# Patient Record
Sex: Female | Born: 1935 | Race: White | Hispanic: No
Health system: Southern US, Community
[De-identification: ages and names within clinical notes are randomized; demographics above are authoritative.]

## PROBLEM LIST (undated history)

## (undated) DIAGNOSIS — J849 Interstitial pulmonary disease, unspecified: Secondary | ICD-10-CM

## (undated) DIAGNOSIS — I10 Essential (primary) hypertension: Secondary | ICD-10-CM

## (undated) DIAGNOSIS — M1990 Unspecified osteoarthritis, unspecified site: Secondary | ICD-10-CM

## (undated) DIAGNOSIS — J449 Chronic obstructive pulmonary disease, unspecified: Secondary | ICD-10-CM

## (undated) DIAGNOSIS — C801 Malignant (primary) neoplasm, unspecified: Secondary | ICD-10-CM

## (undated) DIAGNOSIS — G4730 Sleep apnea, unspecified: Secondary | ICD-10-CM

## (undated) DIAGNOSIS — R590 Localized enlarged lymph nodes: Secondary | ICD-10-CM

## (undated) DIAGNOSIS — R0602 Shortness of breath: Secondary | ICD-10-CM

## (undated) DIAGNOSIS — K219 Gastro-esophageal reflux disease without esophagitis: Secondary | ICD-10-CM

## (undated) DIAGNOSIS — R51 Headache: Secondary | ICD-10-CM

## (undated) DIAGNOSIS — N189 Chronic kidney disease, unspecified: Secondary | ICD-10-CM

## (undated) HISTORY — PX: MULTIPLE TOOTH EXTRACTIONS: SHX2053

## (undated) HISTORY — PX: BREAST LUMPECTOMY WITH AXILLARY LYMPH NODE DISSECTION: SHX5756

---

## 2009-05-02 ENCOUNTER — Encounter: Admission: RE | Admit: 2009-05-02 | Discharge: 2009-05-02 | Payer: Self-pay | Admitting: Geriatric Medicine

## 2010-05-15 ENCOUNTER — Encounter: Admission: RE | Admit: 2010-05-15 | Discharge: 2010-05-15 | Payer: Self-pay | Admitting: Geriatric Medicine

## 2010-08-26 ENCOUNTER — Other Ambulatory Visit: Admission: RE | Admit: 2010-08-26 | Discharge: 2010-08-26 | Payer: Self-pay | Admitting: Obstetrics and Gynecology

## 2011-06-09 ENCOUNTER — Other Ambulatory Visit: Payer: Self-pay | Admitting: Geriatric Medicine

## 2011-06-09 DIAGNOSIS — Z1231 Encounter for screening mammogram for malignant neoplasm of breast: Secondary | ICD-10-CM

## 2011-06-09 DIAGNOSIS — Z853 Personal history of malignant neoplasm of breast: Secondary | ICD-10-CM

## 2011-06-25 ENCOUNTER — Ambulatory Visit
Admission: RE | Admit: 2011-06-25 | Discharge: 2011-06-25 | Disposition: A | Discharge status: Home / Self Care | Payer: Medicare Other | Source: Ambulatory Visit | Attending: Geriatric Medicine | Admitting: Geriatric Medicine

## 2011-06-25 DIAGNOSIS — Z853 Personal history of malignant neoplasm of breast: Secondary | ICD-10-CM

## 2011-06-25 DIAGNOSIS — Z1231 Encounter for screening mammogram for malignant neoplasm of breast: Secondary | ICD-10-CM

## 2011-08-20 ENCOUNTER — Ambulatory Visit: Payer: Medicare Other | Attending: Rheumatology | Admitting: Physical Therapy

## 2011-08-20 DIAGNOSIS — IMO0001 Reserved for inherently not codable concepts without codable children: Secondary | ICD-10-CM | POA: Insufficient documentation

## 2011-08-20 DIAGNOSIS — M2550 Pain in unspecified joint: Secondary | ICD-10-CM | POA: Insufficient documentation

## 2011-08-20 DIAGNOSIS — M2560 Stiffness of unspecified joint, not elsewhere classified: Secondary | ICD-10-CM | POA: Insufficient documentation

## 2011-08-20 DIAGNOSIS — R5381 Other malaise: Secondary | ICD-10-CM | POA: Insufficient documentation

## 2011-08-26 ENCOUNTER — Encounter: Payer: Medicare Other | Admitting: Physical Therapy

## 2011-08-28 ENCOUNTER — Ambulatory Visit: Payer: Medicare Other

## 2011-09-02 ENCOUNTER — Ambulatory Visit: Payer: Medicare Other | Admitting: Physical Therapy

## 2011-09-04 ENCOUNTER — Ambulatory Visit: Payer: Medicare Other | Admitting: Physical Therapy

## 2011-09-08 ENCOUNTER — Ambulatory Visit: Payer: Medicare Other | Admitting: Rehabilitation

## 2011-09-09 ENCOUNTER — Ambulatory Visit: Payer: Medicare Other | Attending: Rheumatology | Admitting: Rehabilitation

## 2011-09-09 DIAGNOSIS — M2550 Pain in unspecified joint: Secondary | ICD-10-CM | POA: Insufficient documentation

## 2011-09-09 DIAGNOSIS — M2560 Stiffness of unspecified joint, not elsewhere classified: Secondary | ICD-10-CM | POA: Insufficient documentation

## 2011-09-09 DIAGNOSIS — IMO0001 Reserved for inherently not codable concepts without codable children: Secondary | ICD-10-CM | POA: Insufficient documentation

## 2011-09-09 DIAGNOSIS — R5381 Other malaise: Secondary | ICD-10-CM | POA: Insufficient documentation

## 2011-09-11 ENCOUNTER — Ambulatory Visit: Payer: Medicare Other | Admitting: Rehabilitation

## 2011-09-22 ENCOUNTER — Encounter: Payer: Medicare Other | Admitting: Physical Therapy

## 2011-09-23 ENCOUNTER — Encounter: Payer: Medicare Other | Admitting: Physical Therapy

## 2011-09-24 ENCOUNTER — Encounter: Payer: Medicare Other | Admitting: Physical Therapy

## 2011-10-02 ENCOUNTER — Ambulatory Visit: Payer: Medicare Other | Admitting: Rehabilitation

## 2011-10-07 ENCOUNTER — Ambulatory Visit: Payer: Medicare Other | Admitting: Physical Therapy

## 2011-10-07 ENCOUNTER — Encounter: Payer: Medicare Other | Admitting: Physical Therapy

## 2011-10-09 ENCOUNTER — Ambulatory Visit: Payer: Medicare Other | Attending: Rheumatology | Admitting: Physical Therapy

## 2011-10-09 DIAGNOSIS — IMO0001 Reserved for inherently not codable concepts without codable children: Secondary | ICD-10-CM | POA: Insufficient documentation

## 2011-10-09 DIAGNOSIS — M2550 Pain in unspecified joint: Secondary | ICD-10-CM | POA: Insufficient documentation

## 2011-10-09 DIAGNOSIS — R5381 Other malaise: Secondary | ICD-10-CM | POA: Insufficient documentation

## 2011-10-09 DIAGNOSIS — M2560 Stiffness of unspecified joint, not elsewhere classified: Secondary | ICD-10-CM | POA: Insufficient documentation

## 2011-10-13 ENCOUNTER — Encounter: Payer: Medicare Other | Admitting: Physical Therapy

## 2011-10-14 ENCOUNTER — Ambulatory Visit: Payer: Medicare Other | Admitting: Physical Therapy

## 2011-10-16 ENCOUNTER — Ambulatory Visit: Payer: Medicare Other | Admitting: Physical Therapy

## 2011-10-23 ENCOUNTER — Ambulatory Visit: Payer: Medicare Other | Admitting: Physical Therapy

## 2011-10-27 ENCOUNTER — Ambulatory Visit: Payer: Medicare Other | Admitting: Physical Therapy

## 2011-10-28 ENCOUNTER — Ambulatory Visit: Payer: Medicare Other | Admitting: Physical Therapy

## 2011-11-04 ENCOUNTER — Ambulatory Visit: Payer: Medicare Other | Admitting: Physical Therapy

## 2011-11-06 ENCOUNTER — Encounter: Payer: Medicare Other | Admitting: Physical Therapy

## 2011-11-11 ENCOUNTER — Encounter: Payer: Medicare Other | Admitting: Physical Therapy

## 2011-11-13 ENCOUNTER — Encounter: Payer: Medicare Other | Admitting: Physical Therapy

## 2011-12-17 DIAGNOSIS — Z79899 Other long term (current) drug therapy: Secondary | ICD-10-CM | POA: Diagnosis not present

## 2011-12-17 DIAGNOSIS — R209 Unspecified disturbances of skin sensation: Secondary | ICD-10-CM | POA: Diagnosis not present

## 2011-12-17 DIAGNOSIS — I129 Hypertensive chronic kidney disease with stage 1 through stage 4 chronic kidney disease, or unspecified chronic kidney disease: Secondary | ICD-10-CM | POA: Diagnosis not present

## 2011-12-29 DIAGNOSIS — D239 Other benign neoplasm of skin, unspecified: Secondary | ICD-10-CM | POA: Diagnosis not present

## 2011-12-29 DIAGNOSIS — L821 Other seborrheic keratosis: Secondary | ICD-10-CM | POA: Diagnosis not present

## 2011-12-29 DIAGNOSIS — L820 Inflamed seborrheic keratosis: Secondary | ICD-10-CM | POA: Diagnosis not present

## 2012-01-07 DIAGNOSIS — H3530 Unspecified macular degeneration: Secondary | ICD-10-CM | POA: Diagnosis not present

## 2012-01-07 DIAGNOSIS — H259 Unspecified age-related cataract: Secondary | ICD-10-CM | POA: Diagnosis not present

## 2012-03-03 DIAGNOSIS — M1990 Unspecified osteoarthritis, unspecified site: Secondary | ICD-10-CM | POA: Diagnosis not present

## 2012-03-03 DIAGNOSIS — M2550 Pain in unspecified joint: Secondary | ICD-10-CM | POA: Diagnosis not present

## 2012-03-03 DIAGNOSIS — N189 Chronic kidney disease, unspecified: Secondary | ICD-10-CM | POA: Diagnosis not present

## 2012-06-21 DIAGNOSIS — I129 Hypertensive chronic kidney disease with stage 1 through stage 4 chronic kidney disease, or unspecified chronic kidney disease: Secondary | ICD-10-CM | POA: Diagnosis not present

## 2012-06-21 DIAGNOSIS — L659 Nonscarring hair loss, unspecified: Secondary | ICD-10-CM | POA: Diagnosis not present

## 2012-06-21 DIAGNOSIS — I714 Abdominal aortic aneurysm, without rupture: Secondary | ICD-10-CM | POA: Diagnosis not present

## 2012-06-30 DIAGNOSIS — M2550 Pain in unspecified joint: Secondary | ICD-10-CM | POA: Diagnosis not present

## 2012-06-30 DIAGNOSIS — M1990 Unspecified osteoarthritis, unspecified site: Secondary | ICD-10-CM | POA: Diagnosis not present

## 2012-06-30 DIAGNOSIS — G5600 Carpal tunnel syndrome, unspecified upper limb: Secondary | ICD-10-CM | POA: Diagnosis not present

## 2012-07-05 DIAGNOSIS — H2510 Age-related nuclear cataract, unspecified eye: Secondary | ICD-10-CM | POA: Diagnosis not present

## 2012-07-05 DIAGNOSIS — H3530 Unspecified macular degeneration: Secondary | ICD-10-CM | POA: Diagnosis not present

## 2012-07-07 DIAGNOSIS — M1710 Unilateral primary osteoarthritis, unspecified knee: Secondary | ICD-10-CM | POA: Diagnosis not present

## 2012-09-02 DIAGNOSIS — Z23 Encounter for immunization: Secondary | ICD-10-CM | POA: Diagnosis not present

## 2012-09-07 DIAGNOSIS — M1710 Unilateral primary osteoarthritis, unspecified knee: Secondary | ICD-10-CM | POA: Diagnosis not present

## 2012-10-07 DIAGNOSIS — J189 Pneumonia, unspecified organism: Secondary | ICD-10-CM | POA: Diagnosis not present

## 2012-10-07 DIAGNOSIS — R509 Fever, unspecified: Secondary | ICD-10-CM | POA: Diagnosis not present

## 2012-10-07 DIAGNOSIS — N390 Urinary tract infection, site not specified: Secondary | ICD-10-CM | POA: Diagnosis not present

## 2012-10-07 DIAGNOSIS — M793 Panniculitis, unspecified: Secondary | ICD-10-CM | POA: Diagnosis not present

## 2012-10-07 DIAGNOSIS — R5381 Other malaise: Secondary | ICD-10-CM | POA: Diagnosis not present

## 2012-11-03 DIAGNOSIS — R5383 Other fatigue: Secondary | ICD-10-CM | POA: Diagnosis not present

## 2012-11-03 DIAGNOSIS — L8990 Pressure ulcer of unspecified site, unspecified stage: Secondary | ICD-10-CM | POA: Diagnosis not present

## 2012-11-03 DIAGNOSIS — Z1331 Encounter for screening for depression: Secondary | ICD-10-CM | POA: Diagnosis not present

## 2012-11-03 DIAGNOSIS — I129 Hypertensive chronic kidney disease with stage 1 through stage 4 chronic kidney disease, or unspecified chronic kidney disease: Secondary | ICD-10-CM | POA: Diagnosis not present

## 2012-11-03 DIAGNOSIS — R259 Unspecified abnormal involuntary movements: Secondary | ICD-10-CM | POA: Diagnosis not present

## 2012-12-20 ENCOUNTER — Other Ambulatory Visit: Payer: Self-pay | Admitting: Geriatric Medicine

## 2012-12-20 DIAGNOSIS — Z1231 Encounter for screening mammogram for malignant neoplasm of breast: Secondary | ICD-10-CM

## 2012-12-28 DIAGNOSIS — M1710 Unilateral primary osteoarthritis, unspecified knee: Secondary | ICD-10-CM | POA: Diagnosis not present

## 2013-01-03 DIAGNOSIS — I129 Hypertensive chronic kidney disease with stage 1 through stage 4 chronic kidney disease, or unspecified chronic kidney disease: Secondary | ICD-10-CM | POA: Diagnosis not present

## 2013-01-03 DIAGNOSIS — B372 Candidiasis of skin and nail: Secondary | ICD-10-CM | POA: Diagnosis not present

## 2013-01-03 DIAGNOSIS — R5381 Other malaise: Secondary | ICD-10-CM | POA: Diagnosis not present

## 2013-01-03 DIAGNOSIS — R5383 Other fatigue: Secondary | ICD-10-CM | POA: Diagnosis not present

## 2013-01-03 DIAGNOSIS — J309 Allergic rhinitis, unspecified: Secondary | ICD-10-CM | POA: Diagnosis not present

## 2013-01-03 DIAGNOSIS — M25569 Pain in unspecified knee: Secondary | ICD-10-CM | POA: Diagnosis not present

## 2013-01-13 ENCOUNTER — Other Ambulatory Visit: Payer: Self-pay | Admitting: Obstetrics and Gynecology

## 2013-01-13 ENCOUNTER — Other Ambulatory Visit (HOSPITAL_COMMUNITY)
Admission: RE | Admit: 2013-01-13 | Discharge: 2013-01-13 | Disposition: A | Discharge status: Home / Self Care | Payer: Medicare Other | Source: Ambulatory Visit | Attending: Obstetrics and Gynecology | Admitting: Obstetrics and Gynecology

## 2013-01-13 DIAGNOSIS — Z01419 Encounter for gynecological examination (general) (routine) without abnormal findings: Secondary | ICD-10-CM | POA: Diagnosis not present

## 2013-01-13 DIAGNOSIS — Z1151 Encounter for screening for human papillomavirus (HPV): Secondary | ICD-10-CM | POA: Insufficient documentation

## 2013-01-20 ENCOUNTER — Ambulatory Visit: Payer: Medicare Other

## 2013-02-08 ENCOUNTER — Ambulatory Visit
Admission: RE | Admit: 2013-02-08 | Discharge: 2013-02-08 | Disposition: A | Discharge status: Home / Self Care | Payer: Medicare Other | Source: Ambulatory Visit | Attending: Geriatric Medicine | Admitting: Geriatric Medicine

## 2013-02-08 DIAGNOSIS — Z1231 Encounter for screening mammogram for malignant neoplasm of breast: Secondary | ICD-10-CM | POA: Diagnosis not present

## 2013-04-13 DIAGNOSIS — I129 Hypertensive chronic kidney disease with stage 1 through stage 4 chronic kidney disease, or unspecified chronic kidney disease: Secondary | ICD-10-CM | POA: Diagnosis not present

## 2013-04-13 DIAGNOSIS — B372 Candidiasis of skin and nail: Secondary | ICD-10-CM | POA: Diagnosis not present

## 2013-05-09 ENCOUNTER — Other Ambulatory Visit (HOSPITAL_COMMUNITY): Payer: Self-pay | Admitting: Geriatric Medicine

## 2013-05-09 DIAGNOSIS — R0602 Shortness of breath: Secondary | ICD-10-CM | POA: Diagnosis not present

## 2013-05-09 DIAGNOSIS — I129 Hypertensive chronic kidney disease with stage 1 through stage 4 chronic kidney disease, or unspecified chronic kidney disease: Secondary | ICD-10-CM | POA: Diagnosis not present

## 2013-05-17 ENCOUNTER — Ambulatory Visit (HOSPITAL_COMMUNITY)
Admission: RE | Admit: 2013-05-17 | Discharge: 2013-05-17 | Disposition: A | Discharge status: Home / Self Care | Payer: Medicare Other | Source: Ambulatory Visit | Attending: Geriatric Medicine | Admitting: Geriatric Medicine

## 2013-05-17 DIAGNOSIS — R0602 Shortness of breath: Secondary | ICD-10-CM | POA: Diagnosis not present

## 2013-05-17 MED ORDER — ALBUTEROL SULFATE (5 MG/ML) 0.5% IN NEBU
2.5000 mg | INHALATION_SOLUTION | Freq: Once | RESPIRATORY_TRACT | Status: AC
  Administered 2013-05-17: 2.5 mg via RESPIRATORY_TRACT

## 2013-05-31 DIAGNOSIS — J449 Chronic obstructive pulmonary disease, unspecified: Secondary | ICD-10-CM | POA: Diagnosis not present

## 2013-05-31 DIAGNOSIS — R21 Rash and other nonspecific skin eruption: Secondary | ICD-10-CM | POA: Diagnosis not present

## 2013-05-31 DIAGNOSIS — R0602 Shortness of breath: Secondary | ICD-10-CM | POA: Diagnosis not present

## 2013-06-24 DIAGNOSIS — R0602 Shortness of breath: Secondary | ICD-10-CM | POA: Diagnosis not present

## 2013-06-29 DIAGNOSIS — I10 Essential (primary) hypertension: Secondary | ICD-10-CM | POA: Diagnosis not present

## 2013-06-29 DIAGNOSIS — R0602 Shortness of breath: Secondary | ICD-10-CM | POA: Diagnosis not present

## 2013-07-13 DIAGNOSIS — H52209 Unspecified astigmatism, unspecified eye: Secondary | ICD-10-CM | POA: Diagnosis not present

## 2013-07-13 DIAGNOSIS — H2510 Age-related nuclear cataract, unspecified eye: Secondary | ICD-10-CM | POA: Diagnosis not present

## 2013-08-18 DIAGNOSIS — Z23 Encounter for immunization: Secondary | ICD-10-CM | POA: Diagnosis not present

## 2013-09-28 NOTE — Patient Instructions (Addendum)
22 Shelia Gardner  09/28/2013   Your procedure is scheduled on: 10/05/13  Report to [facility] at 5:30 AM.  Call this number if you have problems the morning of surgery [phone]: [phone]   Remember:   Do not eat food or drink liquids After Midnight.     Take these medicines the morning of surgery with A SIP OF WATER: Amlodipine,Pantoprazole,may use Albuterol inhaler if needed   Do not wear jewelry, make-up or nail polish.  Do not wear lotions, powders, or perfumes. You may wear deodorant.  Do not shave 48 hours prior to surgery. Men may shave face and neck.  Do not bring valuables to the hospital.  Contacts, dentures or bridgework may not be worn into surgery.  Leave suitcase in the car. After surgery it may be brought to your room.  For patients admitted to the hospital, checkout time is 11:00 AM the day of discharge.   Patients discharged the day of surgery will not be allowed to drive home.  Name and phone number of your driver: n/a  Special Instructions: n/a    Please read over the following fact sheets that you were given: MRSA Information, incentive spirometry fact sheet, blood fact sheet. Merleen Nicely, RN  pre op nurse call if needed [phone]    FAILURE TO FOLLOW THESE INSTRUCTIONS MAY RESULT IN CANCELLATION OF YOUR SURGERY   Patient Signature: ___________________________________________

## 2013-09-28 NOTE — Progress Notes (Signed)
Chest x-ray 10/07/12 on chart, EKG 2010 on chart

## 2013-09-28 NOTE — H&P (Signed)
TOTAL KNEE ADMISSION H&P  Patient is being admitted for right total knee arthroplasty.  Subjective:  Chief Complaint:right knee pain.  HPI: Shelia Gardner, 79 y.o. female, has a history of pain and functional disability in the right knee due to arthritis and has failed non-surgical conservative treatments for greater than 12 weeks to includeNSAID's and/or analgesics, corticosteriod injections, use of assistive devices, weight reduction as appropriate and activity modification.  Onset of symptoms was gradual, starting >10 years ago with gradually worsening course since that time. The patient noted no past surgery on the right knee(s).  Patient currently rates pain in the right knee(s) at 7 out of 10 with activity. Patient has night pain, worsening of pain with activity and weight bearing, pain that interferes with activities of daily living, pain with passive range of motion, crepitus and joint swelling.  Patient has evidence of periarticular osteophytes and joint space narrowing by imaging studies.  There is no active infection.  Past Medical History Impaired Hearing Hypertension COPD Hemorrhoids Urinary Incontinence Breast Cancer Spinal stenosis Osteoarthritis   Past Surgical History Lumpectomy- breast  Medication History Biotin ( Capsule, Oral) Active. AmLODIPine Besylate (2.5MG  Tablet, Oral) Active. Hydrochlorothiazide (25MG  Tablet, Oral) Active. Pantoprazole Sodium (40MG  Tablet DR, Oral) Active. Tylenol Extra Strength (500MG  Tablet, Oral) Active. Nystop (100000 UNIT/GM Powder, External) Active. Protonix (40MG  Tablet DR, Oral) Active. Multiple Vitamin ( Oral) Active. Fish Oil Active. Ocuvite ( Oral) Active. Albuterol Sulfate ER ( Oral) Specific dose unknown - Active. Citracal + D ( Oral) Specific dose unknown - Active. Lisinopril (10MG  Tablet, Oral) Active.   Allergies not on file  History  Substance Use Topics  . Smoking status: Not on file  . Smokeless  tobacco: Not on file  . Alcohol Use: Not on file    Family History Father deceased age 64 due MI Mother deceased age 40 due to breast cancer  Review of Systems  Constitutional: Negative.   HENT: Positive for hearing loss. Negative for congestion, ear discharge, ear pain, nosebleeds, sore throat and tinnitus.   Eyes: Negative.   Respiratory: Positive for shortness of breath. Negative for cough, hemoptysis, sputum production, wheezing and stridor.        SOB with exertion  Cardiovascular: Positive for leg swelling. Negative for chest pain, palpitations, orthopnea, claudication and PND.  Gastrointestinal: Positive for diarrhea and constipation. Negative for heartburn, nausea, vomiting, abdominal pain, blood in stool and melena.  Genitourinary: Positive for frequency. Negative for dysuria, urgency, hematuria and flank pain.  Musculoskeletal: Positive for joint pain. Negative for back pain, falls, myalgias and neck pain.       Right knee pain  Skin: Negative.   Neurological: Positive for tingling. Negative for dizziness, tremors, sensory change, speech change, focal weakness, seizures, loss of consciousness and headaches.  Endo/Heme/Allergies: Positive for environmental allergies. Negative for polydipsia. Does not bruise/bleed easily.  Psychiatric/Behavioral: Negative.     Objective:  Physical Exam  Constitutional: She is oriented to person, place, and time. She appears well-developed and well-nourished. No distress.  HENT:  Head: Normocephalic and atraumatic.  Right Ear: External ear normal.  Left Ear: External ear normal.  Nose: Nose normal.  Mouth/Throat: Oropharynx is clear and moist.  Eyes: Conjunctivae and EOM are normal.  Neck: Normal range of motion. Neck supple.  Cardiovascular: Normal rate, regular rhythm, normal heart sounds and intact distal pulses.   No murmur heard. Respiratory: Effort normal. No respiratory distress. She has wheezes.  GI: Soft. Bowel sounds are  normal. She exhibits no distension.  There is no tenderness.  Musculoskeletal:       Right hip: Normal.       Left hip: Normal.       Right knee: She exhibits decreased range of motion and swelling. She exhibits no effusion and no laceration. Tenderness found. Medial joint line tenderness noted. No lateral joint line tenderness noted.       Left knee: She exhibits decreased range of motion and swelling. She exhibits no effusion and no erythema. Tenderness found. Medial joint line tenderness noted. No lateral joint line tenderness noted.       Right lower leg: She exhibits no tenderness and no swelling.       Left lower leg: She exhibits no tenderness and no swelling.  Right knee tender medially. No effusion or ligamentous instability. 0 to 115 degrees. Signficant patellofemoral crepitus bilaterally. Signficant genu varus bialterally, greater in right. Left knee tender along the medial joint line. Mild soft tissue swelling. Ligaments intact. 0 to 115 degrees. Normal painless motion in the hips. No tenderness to palpation about the greater trochanteric bursae.  Neurological: She is alert and oriented to person, place, and time. She has normal strength and normal reflexes. No sensory deficit.  Skin: No rash noted. She is not diaphoretic. No erythema.  Psychiatric: She has a normal mood and affect. Her behavior is normal.   Vitals Weight: 250 lb Height: 63 in Body Surface Area: 2.25 m Body Mass Index: 44.29 kg/m BP: 135/82 (Sitting, Left Arm, Standard)  HR: 72 bpm  Imaging Review Plain radiographs demonstrate severe degenerative joint disease of the right knee(s). The overall alignment issignificant varus. The bone quality appears to be good for age and reported activity level.  Assessment/Plan:  End stage arthritis, right knee   The patient history, physical examination, clinical judgment of the provider and imaging studies are consistent with end stage degenerative joint disease of  the right knee(s) and total knee arthroplasty is deemed medically necessary. The treatment options including medical management, injection therapy arthroscopy and arthroplasty were discussed at length. The risks and benefits of total knee arthroplasty were presented and reviewed. The risks due to aseptic loosening, infection, stiffness, patella tracking problems, thromboembolic complications and other imponderables were discussed. The patient acknowledged the explanation, agreed to proceed with the plan and consent was signed. Patient is being admitted for inpatient treatment for surgery, pain control, PT, OT, prophylactic antibiotics, VTE prophylaxis, progressive ambulation and ADL's and discharge planning. The patient is planning to be discharged to skilled nursing facility [facility])     Dimitri Ped, [place]

## 2013-09-29 ENCOUNTER — Encounter (HOSPITAL_COMMUNITY): Payer: Self-pay

## 2013-09-29 ENCOUNTER — Other Ambulatory Visit (HOSPITAL_COMMUNITY): Payer: Self-pay | Admitting: *Deleted

## 2013-09-29 ENCOUNTER — Encounter (HOSPITAL_COMMUNITY): Payer: Self-pay | Admitting: Pharmacy Technician

## 2013-09-29 ENCOUNTER — Ambulatory Visit (HOSPITAL_COMMUNITY)
Admission: RE | Admit: 2013-09-29 | Discharge: 2013-09-29 | Disposition: A | Discharge status: Home / Self Care | Payer: Medicare Other | Source: Ambulatory Visit | Attending: Surgical | Admitting: Surgical

## 2013-09-29 ENCOUNTER — Encounter (HOSPITAL_COMMUNITY)
Admission: RE | Admit: 2013-09-29 | Discharge: 2013-09-29 | Disposition: A | Discharge status: Home / Self Care | Payer: Medicare Other | Source: Ambulatory Visit | Attending: Orthopedic Surgery | Admitting: Orthopedic Surgery

## 2013-09-29 DIAGNOSIS — M19019 Primary osteoarthritis, unspecified shoulder: Secondary | ICD-10-CM | POA: Insufficient documentation

## 2013-09-29 DIAGNOSIS — I10 Essential (primary) hypertension: Secondary | ICD-10-CM | POA: Diagnosis not present

## 2013-09-29 DIAGNOSIS — M1710 Unilateral primary osteoarthritis, unspecified knee: Secondary | ICD-10-CM | POA: Insufficient documentation

## 2013-09-29 DIAGNOSIS — Z01818 Encounter for other preprocedural examination: Secondary | ICD-10-CM | POA: Insufficient documentation

## 2013-09-29 DIAGNOSIS — Z01812 Encounter for preprocedural laboratory examination: Secondary | ICD-10-CM | POA: Diagnosis not present

## 2013-09-29 DIAGNOSIS — M5380 Other specified dorsopathies, site unspecified: Secondary | ICD-10-CM | POA: Insufficient documentation

## 2013-09-29 HISTORY — DX: Gastro-esophageal reflux disease without esophagitis: K21.9

## 2013-09-29 HISTORY — DX: Headache: R51

## 2013-09-29 HISTORY — DX: Shortness of breath: R06.02

## 2013-09-29 HISTORY — DX: Unspecified osteoarthritis, unspecified site: M19.90

## 2013-09-29 HISTORY — DX: Essential (primary) hypertension: I10

## 2013-09-29 LAB — COMPREHENSIVE METABOLIC PANEL
ALT: 19 U/L (ref 0–35)
AST: 23 U/L (ref 0–37)
Albumin: 4.1 g/dL (ref 3.5–5.2)
Alkaline Phosphatase: 84 U/L (ref 39–117)
BUN: 22 mg/dL (ref 6–23)
CO2: 25 mEq/L (ref 19–32)
Calcium: 10.7 mg/dL — ABNORMAL HIGH (ref 8.4–10.5)
Chloride: 103 mEq/L (ref 96–112)
Creatinine, Ser: 1.2 mg/dL — ABNORMAL HIGH (ref 0.50–1.10)
GFR calc Af Amer: 48 mL/min — ABNORMAL LOW (ref >90)
GFR calc non Af Amer: 42 mL/min — ABNORMAL LOW (ref >90)
Glucose, Bld: 126 mg/dL — ABNORMAL HIGH (ref 70–99)
Potassium: 4.8 mEq/L (ref 3.5–5.1)
Sodium: 140 mEq/L (ref 135–145)
Total Bilirubin: 0.4 mg/dL (ref 0.3–1.2)
Total Protein: 7.7 g/dL (ref 6.0–8.3)

## 2013-09-29 LAB — ABO/RH: ABO/RH(D): O POS

## 2013-09-29 LAB — CBC
Hemoglobin: 14.1 g/dL (ref 12.0–15.0)
MCH: 29.4 pg (ref 26.0–34.0)
MCHC: 32.3 g/dL (ref 30.0–36.0)
MCV: 91 fL (ref 78.0–100.0)
RBC: 4.79 MIL/uL (ref 3.87–5.11)
RDW: 14.2 % (ref 11.5–15.5)
WBC: 11.2 10*3/uL — ABNORMAL HIGH (ref 4.0–10.5)

## 2013-09-29 LAB — URINALYSIS, ROUTINE W REFLEX MICROSCOPIC
Bilirubin Urine: NEGATIVE
Glucose, UA: NEGATIVE mg/dL
Hgb urine dipstick: NEGATIVE
Ketones, ur: NEGATIVE mg/dL
Nitrite: NEGATIVE
Protein, ur: NEGATIVE mg/dL
Specific Gravity, Urine: 1.015 (ref 1.005–1.030)
Urobilinogen, UA: 0.2 mg/dL (ref 0.0–1.0)
pH: 6 (ref 5.0–8.0)

## 2013-09-29 LAB — PROTIME-INR
INR: 0.98 (ref 0.00–1.49)
Prothrombin Time: 12.8 seconds (ref 11.6–15.2)

## 2013-09-29 LAB — URINE MICROSCOPIC-ADD ON

## 2013-09-29 LAB — APTT: aPTT: 31 seconds (ref 24–37)

## 2013-09-29 LAB — SURGICAL PCR SCREEN: Staphylococcus aureus: NEGATIVE

## 2013-09-29 NOTE — Progress Notes (Signed)
09/29/13 1226  OBSTRUCTIVE SLEEP APNEA  Have you ever been diagnosed with sleep apnea through a sleep study? No  Do you snore loudly (loud enough to be heard through closed doors)?  0  Do you often feel tired, fatigued, or sleepy during the daytime? 1  Has anyone observed you stop breathing during your sleep? 0  Do you have, or are you being treated for high blood pressure? 1  BMI more than 35 kg/m2? 1  Age over 77 years old? 1  Neck circumference greater than 40 cm/18 inches? 0  Gender: 0  Obstructive Sleep Apnea Score 4  Score 4 or greater  Results sent to PCP

## 2013-09-30 LAB — URINE CULTURE
Colony Count: NO GROWTH
Culture: NO GROWTH

## 2013-10-03 NOTE — Progress Notes (Signed)
Patient states she has problems with yeast under breast, on stomach and inner thighs.  Patient was concerned about using the chlorhexidine soap due to not to use on open wounds or sores.  Asked patient if Dr Shelia Gardner was aware of this.  Patient stated Shelia Gardner told her to make sure he is aware of on day of surgery.  Patient states she is unable to tell if has broken skin at areas of under breasts, inner thighs or on stomach.  Instructed patient to use Dial Soap which is antibacterial soap instead of chlorhexidine if she feels uncomfortable using Chlorhexidine.  Patient voiced understanding.

## 2013-10-04 NOTE — Anesthesia Preprocedure Evaluation (Addendum)
Anesthesia Evaluation  Patient identified by MRN, date of birth, ID band Patient awake    Reviewed: Allergy & Precautions, H&P , NPO status , Patient's Chart, lab work & pertinent test results  Airway Mallampati: II TM Distance: >3 FB Neck ROM: full    Dental  (+) Caps All upper front and most of lower teeth are all capped or bridged:   Pulmonary neg pulmonary ROS, shortness of breath and with exertion,  breath sounds clear to auscultation  Pulmonary exam normal       Cardiovascular hypertension, Pt. on medications Rhythm:regular Rate:Normal     Neuro/Psych negative neurological ROS  negative psych ROS   GI/Hepatic negative GI ROS, Neg liver ROS, GERD-  Medicated and Controlled,  Endo/Other  negative endocrine ROSMorbid obesity  Renal/GU negative Renal ROS  negative genitourinary   Musculoskeletal   Abdominal (+) + obese,   Peds  Hematology negative hematology ROS (+)   Anesthesia Other Findings   Reproductive/Obstetrics negative OB ROS                         Anesthesia Physical Anesthesia Plan  ASA: III  Anesthesia Plan: General   Post-op Pain Management:    Induction: Intravenous  Airway Management Planned: Oral ETT  Additional Equipment:   Intra-op Plan:   Post-operative Plan: Extubation in OR  Informed Consent: I have reviewed the patients History and Physical, chart, labs and discussed the procedure including the risks, benefits and alternatives for the proposed anesthesia with the patient or authorized representative who has indicated his/her understanding and acceptance.   Dental Advisory Given  Plan Discussed with: CRNA and Surgeon  Anesthesia Plan Comments:         Anesthesia Quick Evaluation

## 2013-10-05 ENCOUNTER — Inpatient Hospital Stay (HOSPITAL_COMMUNITY): Payer: Medicare Other | Admitting: Anesthesiology

## 2013-10-05 ENCOUNTER — Inpatient Hospital Stay (HOSPITAL_COMMUNITY): Payer: Medicare Other

## 2013-10-05 ENCOUNTER — Encounter (HOSPITAL_COMMUNITY): Payer: Self-pay | Admitting: *Deleted

## 2013-10-05 ENCOUNTER — Encounter (HOSPITAL_COMMUNITY): Payer: Medicare Other | Admitting: Anesthesiology

## 2013-10-05 ENCOUNTER — Encounter (HOSPITAL_COMMUNITY)
Admission: RE | Disposition: A | Discharge status: Skilled Nursing Facility | Payer: Self-pay | Source: Ambulatory Visit | Attending: Orthopedic Surgery

## 2013-10-05 ENCOUNTER — Inpatient Hospital Stay (HOSPITAL_COMMUNITY)
Admission: RE | Admit: 2013-10-05 | Discharge: 2013-10-08 | DRG: 470 | Disposition: A | Discharge status: Skilled Nursing Facility | Payer: Medicare Other | Source: Ambulatory Visit | Attending: Orthopedic Surgery | Admitting: Orthopedic Surgery

## 2013-10-05 DIAGNOSIS — M6281 Muscle weakness (generalized): Secondary | ICD-10-CM | POA: Diagnosis not present

## 2013-10-05 DIAGNOSIS — J449 Chronic obstructive pulmonary disease, unspecified: Secondary | ICD-10-CM | POA: Diagnosis not present

## 2013-10-05 DIAGNOSIS — Z6841 Body Mass Index (BMI) 40.0 and over, adult: Secondary | ICD-10-CM

## 2013-10-05 DIAGNOSIS — M1710 Unilateral primary osteoarthritis, unspecified knee: Principal | ICD-10-CM | POA: Diagnosis present

## 2013-10-05 DIAGNOSIS — Z8249 Family history of ischemic heart disease and other diseases of the circulatory system: Secondary | ICD-10-CM

## 2013-10-05 DIAGNOSIS — Z853 Personal history of malignant neoplasm of breast: Secondary | ICD-10-CM | POA: Diagnosis not present

## 2013-10-05 DIAGNOSIS — M24569 Contracture, unspecified knee: Secondary | ICD-10-CM | POA: Diagnosis present

## 2013-10-05 DIAGNOSIS — J189 Pneumonia, unspecified organism: Secondary | ICD-10-CM | POA: Diagnosis not present

## 2013-10-05 DIAGNOSIS — M1990 Unspecified osteoarthritis, unspecified site: Secondary | ICD-10-CM | POA: Diagnosis not present

## 2013-10-05 DIAGNOSIS — J4489 Other specified chronic obstructive pulmonary disease: Secondary | ICD-10-CM | POA: Diagnosis present

## 2013-10-05 DIAGNOSIS — R32 Unspecified urinary incontinence: Secondary | ICD-10-CM | POA: Diagnosis present

## 2013-10-05 DIAGNOSIS — R269 Unspecified abnormalities of gait and mobility: Secondary | ICD-10-CM | POA: Diagnosis not present

## 2013-10-05 DIAGNOSIS — R509 Fever, unspecified: Secondary | ICD-10-CM | POA: Diagnosis not present

## 2013-10-05 DIAGNOSIS — D62 Acute posthemorrhagic anemia: Secondary | ICD-10-CM | POA: Diagnosis not present

## 2013-10-05 DIAGNOSIS — Z96651 Presence of right artificial knee joint: Secondary | ICD-10-CM

## 2013-10-05 DIAGNOSIS — M4800 Spinal stenosis, site unspecified: Secondary | ICD-10-CM | POA: Diagnosis present

## 2013-10-05 DIAGNOSIS — I10 Essential (primary) hypertension: Secondary | ICD-10-CM | POA: Diagnosis present

## 2013-10-05 DIAGNOSIS — Z471 Aftercare following joint replacement surgery: Secondary | ICD-10-CM | POA: Diagnosis not present

## 2013-10-05 DIAGNOSIS — Z96659 Presence of unspecified artificial knee joint: Secondary | ICD-10-CM | POA: Diagnosis not present

## 2013-10-05 DIAGNOSIS — K219 Gastro-esophageal reflux disease without esophagitis: Secondary | ICD-10-CM | POA: Diagnosis present

## 2013-10-05 DIAGNOSIS — H9190 Unspecified hearing loss, unspecified ear: Secondary | ICD-10-CM | POA: Diagnosis present

## 2013-10-05 DIAGNOSIS — S8990XA Unspecified injury of unspecified lower leg, initial encounter: Secondary | ICD-10-CM | POA: Diagnosis not present

## 2013-10-05 DIAGNOSIS — D649 Anemia, unspecified: Secondary | ICD-10-CM | POA: Diagnosis not present

## 2013-10-05 DIAGNOSIS — M25569 Pain in unspecified knee: Secondary | ICD-10-CM | POA: Diagnosis not present

## 2013-10-05 DIAGNOSIS — M1712 Unilateral primary osteoarthritis, left knee: Secondary | ICD-10-CM | POA: Diagnosis present

## 2013-10-05 DIAGNOSIS — IMO0002 Reserved for concepts with insufficient information to code with codable children: Secondary | ICD-10-CM | POA: Diagnosis not present

## 2013-10-05 DIAGNOSIS — R279 Unspecified lack of coordination: Secondary | ICD-10-CM | POA: Diagnosis not present

## 2013-10-05 HISTORY — PX: TOTAL KNEE ARTHROPLASTY: SHX125

## 2013-10-05 LAB — TYPE AND SCREEN
ABO/RH(D): O POS
Antibody Screen: NEGATIVE

## 2013-10-05 SURGERY — ARTHROPLASTY, KNEE, TOTAL
Anesthesia: General | Site: Knee | Laterality: Right | Wound class: Clean

## 2013-10-05 MED ORDER — HYDROMORPHONE HCL PF 1 MG/ML IJ SOLN
INTRAMUSCULAR | Status: DC | PRN
  Administered 2013-10-05 (×2): 0.5 mg via INTRAVENOUS
  Administered 2013-10-05: 1 mg via INTRAVENOUS

## 2013-10-05 MED ORDER — LACTATED RINGERS IV SOLN: INTRAVENOUS | Status: DC

## 2013-10-05 MED ORDER — AMLODIPINE BESYLATE 2.5 MG PO TABS
2.5000 mg | ORAL_TABLET | Freq: Every morning | ORAL | Status: DC
  Administered 2013-10-05 – 2013-10-07 (×3): 2.5 mg via ORAL
  Filled 2013-10-05 (×3): qty 1

## 2013-10-05 MED ORDER — ONDANSETRON HCL 4 MG/2ML IJ SOLN: 4.0000 mg | Freq: Four times a day (QID) | INTRAMUSCULAR | Status: DC | PRN

## 2013-10-05 MED ORDER — HYDROMORPHONE HCL PF 1 MG/ML IJ SOLN
INTRAMUSCULAR | Status: AC
  Filled 2013-10-05: qty 1

## 2013-10-05 MED ORDER — ALBUTEROL SULFATE HFA 108 (90 BASE) MCG/ACT IN AERS: 2.0000 | INHALATION_SPRAY | Freq: Four times a day (QID) | RESPIRATORY_TRACT | Status: DC | PRN

## 2013-10-05 MED ORDER — CEFAZOLIN SODIUM-DEXTROSE 2-3 GM-% IV SOLR
INTRAVENOUS | Status: AC
  Filled 2013-10-05: qty 50

## 2013-10-05 MED ORDER — PHENOL 1.4 % MT LIQD: 1.0000 | OROMUCOSAL | Status: DC | PRN

## 2013-10-05 MED ORDER — BISACODYL 10 MG RE SUPP: 10.0000 mg | Freq: Every day | RECTAL | Status: DC | PRN

## 2013-10-05 MED ORDER — SODIUM CHLORIDE 0.9 % IJ SOLN
INTRAMUSCULAR | Status: DC | PRN
  Administered 2013-10-05: 09:00:00

## 2013-10-05 MED ORDER — CEFAZOLIN SODIUM 1-5 GM-% IV SOLN
1.0000 g | Freq: Four times a day (QID) | INTRAVENOUS | Status: AC
  Administered 2013-10-05 (×2): 1 g via INTRAVENOUS
  Filled 2013-10-05 (×2): qty 50

## 2013-10-05 MED ORDER — METHOCARBAMOL 500 MG PO TABS
500.0000 mg | ORAL_TABLET | Freq: Four times a day (QID) | ORAL | Status: DC | PRN
  Administered 2013-10-05 – 2013-10-07 (×3): 500 mg via ORAL
  Filled 2013-10-05 (×3): qty 1

## 2013-10-05 MED ORDER — FERROUS SULFATE 325 (65 FE) MG PO TABS
325.0000 mg | ORAL_TABLET | Freq: Three times a day (TID) | ORAL | Status: DC
  Administered 2013-10-06 – 2013-10-08 (×7): 325 mg via ORAL
  Filled 2013-10-05 (×10): qty 1

## 2013-10-05 MED ORDER — GLYCOPYRROLATE 0.2 MG/ML IJ SOLN
INTRAMUSCULAR | Status: DC | PRN
  Administered 2013-10-05: .6 mg via INTRAVENOUS

## 2013-10-05 MED ORDER — ALUM & MAG HYDROXIDE-SIMETH 200-200-20 MG/5ML PO SUSP: 30.0000 mL | ORAL | Status: DC | PRN

## 2013-10-05 MED ORDER — THROMBIN 5000 UNITS EX SOLR
CUTANEOUS | Status: AC
  Filled 2013-10-05: qty 5000

## 2013-10-05 MED ORDER — CELECOXIB 200 MG PO CAPS
200.0000 mg | ORAL_CAPSULE | Freq: Two times a day (BID) | ORAL | Status: DC
  Administered 2013-10-06 – 2013-10-08 (×5): 200 mg via ORAL
  Filled 2013-10-05 (×8): qty 1

## 2013-10-05 MED ORDER — LACTATED RINGERS IV SOLN
INTRAVENOUS | Status: DC
  Administered 2013-10-05 (×2): via INTRAVENOUS

## 2013-10-05 MED ORDER — HYDROCHLOROTHIAZIDE 25 MG PO TABS
25.0000 mg | ORAL_TABLET | Freq: Every morning | ORAL | Status: DC
  Administered 2013-10-05 – 2013-10-07 (×3): 25 mg via ORAL
  Filled 2013-10-05 (×3): qty 1

## 2013-10-05 MED ORDER — NEOSTIGMINE METHYLSULFATE 1 MG/ML IJ SOLN
INTRAMUSCULAR | Status: DC | PRN
  Administered 2013-10-05: 4 mg via INTRAVENOUS

## 2013-10-05 MED ORDER — ALBUTEROL SULFATE (5 MG/ML) 0.5% IN NEBU
2.5000 mg | INHALATION_SOLUTION | Freq: Once | RESPIRATORY_TRACT | Status: AC
  Administered 2013-10-05: 2.5 mg via RESPIRATORY_TRACT
  Filled 2013-10-05: qty 0.5

## 2013-10-05 MED ORDER — CEFAZOLIN SODIUM-DEXTROSE 2-3 GM-% IV SOLR
2.0000 g | INTRAVENOUS | Status: AC
  Administered 2013-10-05: 2 g via INTRAVENOUS

## 2013-10-05 MED ORDER — DEXAMETHASONE SODIUM PHOSPHATE 10 MG/ML IJ SOLN
INTRAMUSCULAR | Status: DC | PRN
  Administered 2013-10-05: 10 mg via INTRAVENOUS

## 2013-10-05 MED ORDER — FLEET ENEMA 7-19 GM/118ML RE ENEM: 1.0000 | ENEMA | Freq: Once | RECTAL | Status: AC | PRN

## 2013-10-05 MED ORDER — SODIUM CHLORIDE 0.9 % IR SOLN
Status: DC | PRN
  Administered 2013-10-05: 3000 mL

## 2013-10-05 MED ORDER — MENTHOL 3 MG MT LOZG: 1.0000 | LOZENGE | OROMUCOSAL | Status: DC | PRN

## 2013-10-05 MED ORDER — POLYETHYLENE GLYCOL 3350 17 G PO PACK
17.0000 g | PACK | Freq: Every day | ORAL | Status: DC | PRN
  Filled 2013-10-05: qty 1

## 2013-10-05 MED ORDER — LACTATED RINGERS IV SOLN
INTRAVENOUS | Status: DC
  Administered 2013-10-06 (×2): via INTRAVENOUS

## 2013-10-05 MED ORDER — ACETAMINOPHEN 650 MG RE SUPP: 650.0000 mg | Freq: Four times a day (QID) | RECTAL | Status: DC | PRN

## 2013-10-05 MED ORDER — ALBUTEROL SULFATE (5 MG/ML) 0.5% IN NEBU
INHALATION_SOLUTION | RESPIRATORY_TRACT | Status: AC
  Administered 2013-10-05: 2.5 mg
  Filled 2013-10-05: qty 0.5

## 2013-10-05 MED ORDER — SODIUM CHLORIDE 0.9 % IN NEBU
INHALATION_SOLUTION | RESPIRATORY_TRACT | Status: AC
  Filled 2013-10-05: qty 3

## 2013-10-05 MED ORDER — BUPIVACAINE LIPOSOME 1.3 % IJ SUSP
20.0000 mL | Freq: Once | INTRAMUSCULAR | Status: AC
  Administered 2013-10-05: 20 mL
  Filled 2013-10-05: qty 20

## 2013-10-05 MED ORDER — HYDROCODONE-ACETAMINOPHEN 5-325 MG PO TABS
1.0000 | ORAL_TABLET | ORAL | Status: DC | PRN
  Administered 2013-10-05 – 2013-10-08 (×9): 1 via ORAL
  Filled 2013-10-05 (×9): qty 1

## 2013-10-05 MED ORDER — PROPOFOL 10 MG/ML IV BOLUS
INTRAVENOUS | Status: DC | PRN
  Administered 2013-10-05: 200 mg via INTRAVENOUS

## 2013-10-05 MED ORDER — ONDANSETRON HCL 4 MG PO TABS: 4.0000 mg | ORAL_TABLET | Freq: Four times a day (QID) | ORAL | Status: DC | PRN

## 2013-10-05 MED ORDER — LISINOPRIL 5 MG PO TABS
5.0000 mg | ORAL_TABLET | Freq: Every morning | ORAL | Status: DC
  Administered 2013-10-05 – 2013-10-07 (×3): 5 mg via ORAL
  Filled 2013-10-05 (×3): qty 1

## 2013-10-05 MED ORDER — ROCURONIUM BROMIDE 100 MG/10ML IV SOLN
INTRAVENOUS | Status: DC | PRN
  Administered 2013-10-05: 50 mg via INTRAVENOUS

## 2013-10-05 MED ORDER — HYDROMORPHONE HCL PF 1 MG/ML IJ SOLN
0.2500 mg | INTRAMUSCULAR | Status: DC | PRN
  Administered 2013-10-05 (×2): 0.5 mg via INTRAVENOUS

## 2013-10-05 MED ORDER — LIDOCAINE HCL (CARDIAC) 20 MG/ML IV SOLN
INTRAVENOUS | Status: DC | PRN
  Administered 2013-10-05: 100 mg via INTRAVENOUS

## 2013-10-05 MED ORDER — OXYCODONE-ACETAMINOPHEN 5-325 MG PO TABS: 2.0000 | ORAL_TABLET | ORAL | Status: DC | PRN

## 2013-10-05 MED ORDER — THROMBIN 5000 UNITS EX SOLR
OROMUCOSAL | Status: DC | PRN
  Administered 2013-10-05: 09:00:00 via TOPICAL

## 2013-10-05 MED ORDER — HYDROMORPHONE HCL PF 1 MG/ML IJ SOLN: 1.0000 mg | INTRAMUSCULAR | Status: DC | PRN

## 2013-10-05 MED ORDER — RIVAROXABAN 10 MG PO TABS
10.0000 mg | ORAL_TABLET | Freq: Every day | ORAL | Status: DC
  Administered 2013-10-06 – 2013-10-08 (×3): 10 mg via ORAL
  Filled 2013-10-05 (×5): qty 1

## 2013-10-05 MED ORDER — FENTANYL CITRATE 0.05 MG/ML IJ SOLN
INTRAMUSCULAR | Status: DC | PRN
  Administered 2013-10-05: 100 ug via INTRAVENOUS
  Administered 2013-10-05: 50 ug via INTRAVENOUS
  Administered 2013-10-05: 100 ug via INTRAVENOUS

## 2013-10-05 MED ORDER — ACETAMINOPHEN 325 MG PO TABS
650.0000 mg | ORAL_TABLET | Freq: Four times a day (QID) | ORAL | Status: DC | PRN
  Administered 2013-10-06 – 2013-10-08 (×3): 650 mg via ORAL
  Filled 2013-10-05 (×3): qty 2

## 2013-10-05 MED ORDER — SODIUM CHLORIDE 0.9 % IR SOLN
Status: DC | PRN
  Administered 2013-10-05: 08:00:00

## 2013-10-05 MED ORDER — METHOCARBAMOL 100 MG/ML IJ SOLN
500.0000 mg | Freq: Four times a day (QID) | INTRAVENOUS | Status: DC | PRN
  Administered 2013-10-05: 500 mg via INTRAVENOUS
  Filled 2013-10-05: qty 5

## 2013-10-05 MED ORDER — ONDANSETRON HCL 4 MG/2ML IJ SOLN
INTRAMUSCULAR | Status: DC | PRN
  Administered 2013-10-05: 4 mg via INTRAVENOUS

## 2013-10-05 SURGICAL SUPPLY — 67 items
ANCHOR PEEK ZIP 5.5 NDL NO2 (Orthopedic Implant) ×2 IMPLANT
BAG ZIPLOCK 12X15 (MISCELLANEOUS) ×2 IMPLANT
BANDAGE ELASTIC 4 VELCRO ST LF (GAUZE/BANDAGES/DRESSINGS) ×2 IMPLANT
BANDAGE ELASTIC 6 VELCRO ST LF (GAUZE/BANDAGES/DRESSINGS) ×2 IMPLANT
BANDAGE ESMARK 6X9 LF (GAUZE/BANDAGES/DRESSINGS) ×1 IMPLANT
BLADE SAG 18X100X1.27 (BLADE) ×2 IMPLANT
BLADE SAW SGTL 11.0X1.19X90.0M (BLADE) ×2 IMPLANT
BNDG ESMARK 6X9 LF (GAUZE/BANDAGES/DRESSINGS) ×2
BONE CEMENT GENTAMICIN (Cement) ×4 IMPLANT
CAPT RP KNEE ×2 IMPLANT
CEMENT BONE GENTAMICIN 40 (Cement) ×2 IMPLANT
CLOTH BEACON ORANGE TIMEOUT ST (SAFETY) ×2 IMPLANT
CUFF TOURN SGL QUICK 34 (TOURNIQUET CUFF) ×1
CUFF TRNQT CYL 34X4X40X1 (TOURNIQUET CUFF) ×1 IMPLANT
DERMABOND ADVANCED (GAUZE/BANDAGES/DRESSINGS) ×1
DERMABOND ADVANCED .7 DNX12 (GAUZE/BANDAGES/DRESSINGS) ×1 IMPLANT
DRAPE EXTREMITY T 121X128X90 (DRAPE) ×2 IMPLANT
DRAPE INCISE IOBAN 66X45 STRL (DRAPES) ×2 IMPLANT
DRAPE LG THREE QUARTER DISP (DRAPES) ×2 IMPLANT
DRAPE POUCH INSTRU U-SHP 10X18 (DRAPES) ×2 IMPLANT
DRAPE U-SHAPE 47X51 STRL (DRAPES) ×2 IMPLANT
DRSG AQUACEL AG ADV 3.5X10 (GAUZE/BANDAGES/DRESSINGS) ×2 IMPLANT
DRSG PAD ABDOMINAL 8X10 ST (GAUZE/BANDAGES/DRESSINGS) ×8 IMPLANT
DRSG TEGADERM 4X4.75 (GAUZE/BANDAGES/DRESSINGS) ×2 IMPLANT
DRSG TEGADERM 8X12 (GAUZE/BANDAGES/DRESSINGS) ×2 IMPLANT
DURAPREP 26ML APPLICATOR (WOUND CARE) ×2 IMPLANT
ELECT REM PT RETURN 9FT ADLT (ELECTROSURGICAL) ×2
ELECTRODE REM PT RTRN 9FT ADLT (ELECTROSURGICAL) ×1 IMPLANT
EVACUATOR 1/8 PVC DRAIN (DRAIN) ×2 IMPLANT
GAUZE SPONGE 2X2 8PLY STRL LF (GAUZE/BANDAGES/DRESSINGS) ×1 IMPLANT
GLOVE BIOGEL PI IND STRL 8 (GLOVE) ×1 IMPLANT
GLOVE BIOGEL PI INDICATOR 8 (GLOVE) ×1
GLOVE ECLIPSE 8.0 STRL XLNG CF (GLOVE) ×4 IMPLANT
GLOVE SURG SS PI 6.5 STRL IVOR (GLOVE) ×4 IMPLANT
GOWN PREVENTION PLUS LG XLONG (DISPOSABLE) ×6 IMPLANT
GOWN STRL REIN XL XLG (GOWN DISPOSABLE) ×2 IMPLANT
HANDPIECE INTERPULSE COAX TIP (DISPOSABLE) ×1
IMMOBILIZER KNEE 20 (SOFTGOODS)
IMMOBILIZER KNEE 20 THIGH 36 (SOFTGOODS) IMPLANT
IMMOBILIZER KNEE 22 UNIV (SOFTGOODS) ×2 IMPLANT
KIT BASIN OR (CUSTOM PROCEDURE TRAY) ×2 IMPLANT
MANIFOLD NEPTUNE II (INSTRUMENTS) ×2 IMPLANT
NEEDLE HYPO 22GX1.5 SAFETY (NEEDLE) ×2 IMPLANT
NS IRRIG 1000ML POUR BTL (IV SOLUTION) ×2 IMPLANT
PACK TOTAL JOINT (CUSTOM PROCEDURE TRAY) ×2 IMPLANT
PAD ABD 7.5X8 STRL (GAUZE/BANDAGES/DRESSINGS) ×2 IMPLANT
PADDING CAST COTTON 6X4 STRL (CAST SUPPLIES) ×2 IMPLANT
POSITIONER SURGICAL ARM (MISCELLANEOUS) ×2 IMPLANT
SET HNDPC FAN SPRY TIP SCT (DISPOSABLE) ×1 IMPLANT
SET PAD KNEE POSITIONER (MISCELLANEOUS) ×2 IMPLANT
SPONGE GAUZE 2X2 STER 10/PKG (GAUZE/BANDAGES/DRESSINGS) ×1
SPONGE LAP 18X18 X RAY DECT (DISPOSABLE) ×2 IMPLANT
SPONGE SURGIFOAM ABS GEL 100 (HEMOSTASIS) ×2 IMPLANT
STAPLER VISISTAT 35W (STAPLE) IMPLANT
SUT BONE WAX W31G (SUTURE) ×2 IMPLANT
SUT MNCRL AB 4-0 PS2 18 (SUTURE) ×2 IMPLANT
SUT VIC AB 1 CT1 27 (SUTURE) ×2
SUT VIC AB 1 CT1 27XBRD ANTBC (SUTURE) ×2 IMPLANT
SUT VIC AB 2-0 CT1 27 (SUTURE) ×4
SUT VIC AB 2-0 CT1 TAPERPNT 27 (SUTURE) ×4 IMPLANT
SUT VLOC 180 0 24IN GS25 (SUTURE) ×2 IMPLANT
SYR 20CC LL (SYRINGE) ×2 IMPLANT
TOWEL OR 17X26 10 PK STRL BLUE (TOWEL DISPOSABLE) ×4 IMPLANT
TOWER CARTRIDGE SMART MIX (DISPOSABLE) ×2 IMPLANT
TRAY FOLEY CATH 14FRSI W/METER (CATHETERS) ×2 IMPLANT
WATER STERILE IRR 1500ML POUR (IV SOLUTION) ×2 IMPLANT
WRAP KNEE MAXI GEL POST OP (GAUZE/BANDAGES/DRESSINGS) ×4 IMPLANT

## 2013-10-05 NOTE — Interval H&P Note (Signed)
History and Physical Interval Note:  10/05/2013 7:10 AM  Shelia Gardner  has presented today for surgery, with the diagnosis of OA RIGHT KNEE   The various methods of treatment have been discussed with the patient and family. After consideration of risks, benefits and other options for treatment, the patient has consented to  Procedure(s): TOTAL RIGHT  KNEE ARTHROPLASTY (Right) as a surgical intervention .  The patient's history has been reviewed, patient examined, no change in status, stable for surgery.  I have reviewed the patient's chart and labs.  Questions were answered to the patient's satisfaction.     [name]

## 2013-10-05 NOTE — Progress Notes (Signed)
Utilization review completed.  

## 2013-10-05 NOTE — Brief Op Note (Signed)
10/05/2013  9:45 AM  PATIENT:  Bethanie Dicker Countess  79 y.o. female  PRE-OPERATIVE DIAGNOSIS:  OA RIGHT KNEE ,with severe  Contracture. Morbid Obesity  POST-OPERATIVE DIAGNOSIS:  OA right knee,with severe contracture. Morbid Obesity.  PROCEDURE:  Procedure(s): TOTAL RIGHT  KNEE ARTHROPLASTY (Right) and release of Flexion Contracture.  SURGEON:  Surgeon(s) and Role:    * Jacki Cones, MD - Primary  PHYSICIAN ASSISTANT: Dimitri Ped PA  ASSISTANTS:Amber [place] PA  ANESTHESIA:   general  EBL:  Total I/O In: 1000 [I.V.:1000] Out: 160 [Urine:160]  BLOOD ADMINISTERED:none  DRAINS: (one) Hemovact drain(s) in the Right Knee with  Suction Open   LOCAL MEDICATIONS USED:  BUPIVICAINE 20cc mixed with 20cc of Normal Saline  SPECIMEN:  No Specimen  DISPOSITION OF SPECIMEN:  N/A  COUNTS:  YES  TOURNIQUET:  * Missing tourniquet times found for documented tourniquets in log:  [id number] *  DICTATION: .Other Dictation: Dictation Number [id number]  PLAN OF CARE: Admit to inpatient   PATIENT DISPOSITION:  Stable in OR   Delay start of Pharmacological VTE agent (>24hrs) due to surgical blood loss or risk of bleeding: yes

## 2013-10-05 NOTE — Anesthesia Postprocedure Evaluation (Signed)
  Anesthesia Post-op Note  Patient: Shelia Gardner  Procedure(s) Performed: Procedure(s) (LRB): TOTAL RIGHT  KNEE ARTHROPLASTY (Right)  Patient Location: PACU  Anesthesia Type: General  Level of Consciousness: awake and alert   Airway and Oxygen Therapy: Patient Spontanous Breathing  Post-op Pain: mild  Post-op Assessment: Post-op Vital signs reviewed, Patient's Cardiovascular Status Stable, Respiratory Function Stable, Patent Airway and No signs of Nausea or vomiting  Last Vitals:  Filed Vitals:   10/05/13 1214  BP: 149/61  Pulse: 80  Temp: 36.8 C  Resp: 12    Post-op Vital Signs: stable   Complications: No apparent anesthesia complications

## 2013-10-05 NOTE — Preoperative (Signed)
Beta Blockers   Reason not to administer Beta Blockers:Not Applicable 

## 2013-10-05 NOTE — Transfer of Care (Signed)
Immediate Anesthesia Transfer of Care Note  Patient: Shelia Gardner  Procedure(s) Performed: Procedure(s): TOTAL RIGHT  KNEE ARTHROPLASTY (Right)  Patient Location: PACU  Anesthesia Type:General  Level of Consciousness: awake, alert , oriented and patient cooperative  Airway & Oxygen Therapy: Patient Spontanous Breathing and Patient connected to nasal cannula oxygen  Post-op Assessment: Report given to PACU RN, Post -op Vital signs reviewed and stable and Patient moving all extremities  Post vital signs: Reviewed and stable  Complications: No apparent anesthesia complications

## 2013-10-06 ENCOUNTER — Encounter (HOSPITAL_COMMUNITY): Payer: Self-pay | Admitting: Orthopedic Surgery

## 2013-10-06 LAB — BASIC METABOLIC PANEL
BUN: 19 mg/dL (ref 6–23)
CO2: 24 mEq/L (ref 19–32)
Chloride: 101 mEq/L (ref 96–112)
Creatinine, Ser: 1.05 mg/dL (ref 0.50–1.10)
GFR calc Af Amer: 57 mL/min — ABNORMAL LOW (ref >90)
Glucose, Bld: 157 mg/dL — ABNORMAL HIGH (ref 70–99)
Potassium: 4.3 mEq/L (ref 3.5–5.1)

## 2013-10-06 LAB — CBC
HCT: 33.5 % — ABNORMAL LOW (ref 36.0–46.0)
Hemoglobin: 11.3 g/dL — ABNORMAL LOW (ref 12.0–15.0)
MCH: 30.3 pg (ref 26.0–34.0)
MCV: 89.8 fL (ref 78.0–100.0)
Platelets: 235 10*3/uL (ref 150–400)
RBC: 3.73 MIL/uL — ABNORMAL LOW (ref 3.87–5.11)
RDW: 14.1 % (ref 11.5–15.5)
WBC: 20.3 10*3/uL — ABNORMAL HIGH (ref 4.0–10.5)

## 2013-10-06 MED ORDER — LIP MEDEX EX OINT
TOPICAL_OINTMENT | CUTANEOUS | Status: DC | PRN
  Filled 2013-10-06: qty 7

## 2013-10-06 NOTE — Progress Notes (Signed)
OT Cancellation Note  Patient Details Name: Shelia Gardner MRN: [id number] DOB: 01-03-1930   Cancelled Treatment:    Reason Eval/Treat Not Completed: Other (comment) Defer OT to SNF.  [name] [phone] 10/06/2013, 12:59 PM

## 2013-10-06 NOTE — Progress Notes (Signed)
Pt's O2 decreased from 4l to 2L, unable to decrease to RA d/t sat dropping to 88-92%.  Transferred to rm 1814 with O2.   Condition stable.

## 2013-10-06 NOTE — Op Note (Signed)
NAMEKORAIMA, ALBERTSEN           ACCOUNT NO.:  [id number]  MEDICAL RECORD NO.:  [id number]  LOCATION:  1228                         FACILITY:  [facility]  PHYSICIAN:  Georges Lynch. Makenzi Bannister, M.D.DATE OF BIRTH:  04-Sep-1930  DATE OF PROCEDURE:  10/05/2013 DATE OF DISCHARGE:                              OPERATIVE REPORT   SURGEON:  Georges Lynch. Darrelyn Hillock, M.D.  ASSISTANT:  Dimitri Ped, [place]  PREOPERATIVE DIAGNOSES: 1. Severe flexion contracture, right knee. 2. Bone on bone severe degenerative arthritis with a flexion     contracture. 3. Morbid obesity.  POSTOPERATIVE DIAGNOSES: 1. Severe flexion contracture, right knee. 2. Bone on bone severe degenerative arthritis with a flexion     contracture.  OPERATION:  Right total knee arthroplasty utilizing the DePuy system. All 3 components were cemented.  Gentamicin was used in the cement.  The sizes used were as follows a size 3 right posterior cruciate sacrificing type femoral component.  The tibial tray was a size 4.  The insert was a size 3, 15 mm thickness rotating platform.  The patella was a size 38 with 3 pegs.  DESCRIPTION OF PROCEDURE:  Under general anesthesia, routine orthopedic prep and draping of the right lower extremity was carried out. Appropriate time-out was first carried out.  I also marked the appropriate right leg in the holding area.  The patient had 2 g of IV Ancef.  At this time, the leg was exsanguinated with an Esmarch, tourniquet was elevated at 325 mmHg.  The leg was placed in the [facility] knee holder.  The knee was flexed and an anterior approach to the hip and knee was carried out.  Bleeders were identified and cauterized.  I then created 2 flaps.  A median parapatellar incision was made.  Patella was reflected laterally.  Knee was flexed again and I did medial and lateral meniscectomies and excised the anterior and posterior cruciate ligaments.  At this time, initial drill hole was made in the intercondylar notch  of the femur.  Guide rod was inserted.  We removed 30 mm thickness off the distal femur because of her contraction and she was severely morbidly obese.  At this time, after removing the 13 mm thickness, we measured the femur to be a size 3 right.  We then made our appropriate anterior-posterior chamfer cuts for a size 3 right femoral component.  We then directed attention to the tibia and we utilized initial drill hole in the intercondylar notch and in the tibial plateau. I then removed 6 mm thickness.  We had to go take another 2 mm thickness off the tibia mainly because of the severe collapse of the medial joint. After this, the spacer blocks were inserted.  I then removed the posterior osteophytes.  We thoroughly irrigated out the area, searched for loose pieces of bone.  At this time, we then directed attention to the spacer device and we felt that the 15 mm was the best choice for [place] at this point, she was with morbid obesity especially.  We then cut our keel cut in the usual fashion and the tibial plateau after I made some drill holes in the medial tibial plateau region.  After this, we made  sure that we did appropriate size tray again it was a size 4.  We then directed attention after making our keel cuts were made.  Attention to the plate to the distal femur with notch cut out of the distal femur in usual.  After this, we inserted our trial components for a size 3 right femur and size 4 tray with a 15-mm thickness insert.  We then did a resurfacing procedure on the patella for the root in the usual fashion. Made 3 drill holes in the patella, articular surface for a 38 mm patella.  All trial components were removed, thoroughly water picked out the knee and cemented all 3 components in simultaneously with gentamicin in the cement.  Once the cement was hardened, all loose pieces were removed.  I then injected a mixture of 20 mL of Exparel with 20 mL of normal saline I used after  mixture injected posterior medial compartments, also up into the vastus and quadriceps muscle.  At this time, I water picked out the knee, made sure there were no other loose pieces of cement.  I inserted my permanent size 15, size 3 tray with a rotating platform, reduced the knee and had excellent stability, excellent motion.  The patella then was reflected back into its normal position.  I inserted a Stryker anchor in the proximal tibia to reinforce the repair of the patellar tendon.  Remaining part of the wound was closed in layers over Hemovac drain.  Sterile dressings were applied.          ______________________________ Georges Lynch Darrelyn Hillock, M.D.     RAG/MEDQ  D:  10/05/2013  T:  10/06/2013  Job:  [id number]

## 2013-10-06 NOTE — Progress Notes (Signed)
Clinical Social Work Department BRIEF PSYCHOSOCIAL ASSESSMENT 10/06/2013  Patient:  Shelia Gardner, Shelia Gardner     Account Number:  [id number]     Admit date:  10/05/2013  Clinical Social Worker:  Candie Chroman  Date/Time:  10/06/2013 02:40 PM  Referred by:  Physician  Date Referred:  10/06/2013 Referred for  SNF Placement   Other Referral:   Interview type:  Patient Other interview type:    PSYCHOSOCIAL DATA Living Status:  HUSBAND Admitted from facility:   Level of care:   Primary support name:  Raiford Simmonds Primary support relationship to patient:  CHILD, ADULT Degree of support available:   unclear    CURRENT CONCERNS Current Concerns  Post-Acute Placement   Other Concerns:    SOCIAL WORK ASSESSMENT / PLAN Pt is an 79 yr old female living at home prior to hospitalization. CSW met with pt / spouse to assist with d/c planning. Pt has made prior arrangements to have ST SNF at [facility] following hospital d/c. CSW has contacted SNF and d/c plans have been confirmed. CSW will continue to follow to assist with d/c planning needs.   Assessment/plan status:  Psychosocial Support/Ongoing Assessment of Needs Other assessment/ plan:   Information/referral to community resources:   Insurance coverage for SNF and ambulance transport reviewed.    PATIENT'S/FAMILY'S RESPONSE TO PLAN OF CARE: Pt is looking forward to having rehab at [facility].   Cori Razor LCSW [phone]

## 2013-10-06 NOTE — Evaluation (Signed)
I have reviewed this note and agree with all findings. Kati Barret Esquivel, PT, DPT Pager: [phone]   

## 2013-10-06 NOTE — Progress Notes (Signed)
Clinical Social Work Department CLINICAL SOCIAL WORK PLACEMENT NOTE 10/06/2013  Patient:  Shelia, Gardner  Account Number:  [id number] Admit date:  10/05/2013  Clinical Social Worker:  Cori Razor, LCSW  Date/time:  10/06/2013 04:55 PM  Clinical Social Work is seeking post-discharge placement for this patient at the following level of care:   SKILLED NURSING   (*CSW will update this form in Epic as items are completed)     Patient/family provided with [facility] Department of Clinical Social Work's list of facilities offering this level of care within the geographic area requested by the patient (or if unable, by the patient's family).  10/06/2013  Patient/family informed of their freedom to choose among providers that offer the needed level of care, that participate in Medicare, Medicaid or managed care program needed by the patient, have an available bed and are willing to accept the patient.    Patient/family informed of [facility]' ownership interest in [facility], as well as of the fact that they are under no obligation to receive care at this facility.  PASARR submitted to EDS on 10/06/2013 PASARR number received from EDS on 10/06/2013  FL2 transmitted to all facilities in geographic area requested by pt/family on  09/29/2013 FL2 transmitted to all facilities within larger geographic area on   Patient informed that his/her managed care company has contracts with or will negotiate with  certain facilities, including the following:     Patient/family informed of bed offers received:  10/06/2013 Patient chooses bed at [facility] Physician recommends and patient chooses bed at    Patient to be transferred to [facility] on   Patient to be transferred to facility by   The following physician request were entered in Epic:   Additional Comments:  Cori Razor LCSW [phone]

## 2013-10-06 NOTE — Progress Notes (Signed)
   Subjective: 1 Day Post-Op Procedure(s) (LRB): TOTAL RIGHT  KNEE ARTHROPLASTY (Right) Patient reports pain as moderate.   Patient seen in rounds with Dr. Darrelyn Hillock. Patient is doing ok this morning but is feeling quite tired since she did not get much rest last night. No SOB or chest pain. She is also having some pain in the left knee. We will start therapy today.  Plan is to go Skilled nursing facility after hospital stay.  Objective: Vital signs in last 24 hours: Temp:  [97.9 F (36.6 C)-98.3 F (36.8 C)] 98.3 F (36.8 C) (10/30 0400) Pulse Rate:  [73-107] 79 (10/30 0600) Resp:  [7-33] 14 (10/30 0600) BP: (113-174)/(27-80) 141/76 mmHg (10/30 0600) SpO2:  [79 %-99 %] 95 % (10/30 0600)  Intake/Output from previous day:  Intake/Output Summary (Last 24 hours) at 10/06/13 0732 Last data filed at 10/06/13 0700  Gross per 24 hour  Intake   3990 ml  Output   3790 ml  Net    200 ml    Intake/Output this shift:    Labs:  Recent Labs  10/06/13 0343  HGB 11.3*    Recent Labs  10/06/13 0343  WBC 20.3*  RBC 3.73*  HCT 33.5*  PLT 235    Recent Labs  10/06/13 0343  NA 135  K 4.3  CL 101  CO2 24  BUN 19  CREATININE 1.05  GLUCOSE 157*  CALCIUM 9.0    EXAM General - Patient is Alert and Oriented Extremity - Neurologically intact Neurovascular intact No cellulitis present Compartment soft Dressing - dressing C/D/I Motor Function - intact, moving foot and toes well on exam.  Hemovac pulled without difficulty.  Past Medical History  Diagnosis Date  . Hypertension   . Shortness of breath     with exertion  . GERD (gastroesophageal reflux disease)   . 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 Arthritis     Assessment/Plan: 1 Day Post-Op Procedure(s) (LRB): TOTAL RIGHT  KNEE ARTHROPLASTY (Right) Active Problems:   Osteoarthritis of left knee Advance diet Up with therapy  DVT Prophylaxis - Xarelto Weight-Bearing as tolerated    Her sats have been improved. Will transfer to ortho floor. Will start therapy today.   Kailin Leu LAUREN 10/06/2013, 7:32 AM

## 2013-10-06 NOTE — Evaluation (Signed)
Physical Therapy Evaluation Patient Details Name: Shelia Gardner MRN: [id number] DOB: 1930-08-21 Today's Date: 10/06/2013 Time: 4098-1191 PT Time Calculation (min): 21 min  PT Assessment / Plan / Recommendation History of Present Illness  Pt is an 79 y/o female s/p R TKA.  Clinical Impression  Pt admitted with above. Pt currently presenting with functional limitations due to deficits listed below (see PT Problem List). Pt would benefit from skilled PT to increase independence and safety during mobility and to allow d/c to venue listed below. Pt able to perform sit<>stand and stand pivot transfers with +2 assist, pt's mobility limited by pain. PT educated pt on wearing knee immobilizer on RLE when OOB and to not place pillows under R knee in order to improve ROM.    PT Assessment  Patient needs continued PT services    Follow Up Recommendations  SNF    Does the patient have the potential to tolerate intense rehabilitation      Barriers to Discharge        Equipment Recommendations  None recommended by PT    Recommendations for Other Services     Frequency 7X/week    Precautions / Restrictions Precautions Precautions: Fall Required Braces or Orthoses: Knee Immobilizer - Right Restrictions Weight Bearing Restrictions: Yes RLE Weight Bearing: Weight bearing as tolerated   Pertinent Vitals/Pain No c/o at rest, with increase in RLE pain during mobility (7-8/10), pt took seated rest break to decrease pain and positioned to comfort at end of session.  PT encouraged pt to ask RN for pain meds in order to decrease pain.  Pt reports she does not use O2 at home, O2 taken off pt during bed mobility and reapplied when SaO2 dropped to 85% after stand pivot transfer.  Pt did no c/o SOB.      Mobility  Bed Mobility Bed Mobility: Supine to Sit;Sitting - Scoot to Edge of Bed Supine to Sit: 1: +2 Total assist Supine to Sit: Patient Percentage: 40% Sitting - Scoot to Edge of Bed: 3:  Mod assist Details for Bed Mobility Assistance: assist required to guide trunk into sitting and to assist R LE off EOB. pt required seated rest break upon sitting EOB due to 7-8/10 RLE pain. PT encouraged pt to practice pursed lip breathing to improve SaO2 and decrease pain. SaO2 on monitored during session, please see vitals section for details. Transfers Transfers: Sit to Stand;Stand to Sit;Stand Pivot Transfers Sit to Stand: 1: +2 Total assist;From bed;With upper extremity assist Sit to Stand: Patient Percentage: 30% Stand to Sit: 1: +2 Total assist;To chair/3-in-1;With upper extremity assist;With armrests Stand to Sit: Patient Percentage: 30% Stand Pivot Transfers: 1: +2 Total assist Stand Pivot Transfers: Patient Percentage: 40% Details for Transfer Assistance: +2 asssist due to pt reporting bil LE's "give out" during ambulation and increased pain with mobility. VC's to keep RLE extended during sit<>stand, transfer sequence, and hand placement.  Pt able to take several steps forward with RW and +2 assist prior to turning to sit in chair. Pt required increased time during all mobility due to fatigue and RLE pain. Ambulation/Gait Ambulation/Gait Assistance: Not tested (comment)    Exercises     PT Diagnosis: Difficulty walking;Acute pain;Generalized weakness  PT Problem List: Decreased strength;Decreased range of motion;Decreased activity tolerance;Decreased balance;Decreased mobility;Decreased knowledge of use of DME;Pain PT Treatment Interventions: DME instruction;Gait training;Functional mobility training;Therapeutic activities;Therapeutic exercise;Balance training;Neuromuscular re-education;Patient/family education     PT Goals(Current goals can be found in the care plan section) Acute Rehab PT  Goals Patient Stated Goal: none specified PT Goal Formulation: With patient Time For Goal Achievement: 10/20/13 Potential to Achieve Goals: Good  Visit Information  Last PT Received On:  10/06/13 Assistance Needed: +2 History of Present Illness: Pt is an 79 y/o female s/p R TKA.       Prior Functioning  Home Living Family/patient expects to be discharged to:: Skilled nursing facility (pt lives with spouse in assisted living facility but plans to d/c to SNF.) Home Equipment: Walker - 4 wheels Prior Function Level of Independence: Independent with assistive device(s) Comments: pt reports bil knees would "give out" during ambulation prior to surgery. Communication Communication: No difficulties    Cognition  Cognition Arousal/Alertness: Awake/alert Behavior During Therapy: WFL for tasks assessed/performed Overall Cognitive Status: Within Functional Limits for tasks assessed    Extremity/Trunk Assessment Lower Extremity Assessment Lower Extremity Assessment: RLE deficits/detail;LLE deficits/detail RLE Deficits / Details: unable to test strength but weakness suspected as pt reports R knee "giving out" during ambulation prior to hospitalization. RLE: Unable to fully assess due to pain LLE Deficits / Details: unable to test strength but weakness suspected as pt reports L knee "giving out" during ambulation prior to hospitalization. pt reports occassional numbness/tingling in L foot.   Balance    End of Session PT - End of Session Equipment Utilized During Treatment: Oxygen Activity Tolerance: Patient limited by pain Patient left: in chair;with call bell/phone within reach Nurse Communication: Mobility status  GP     Sol Blazing 10/06/2013, 1:13 PM

## 2013-10-07 ENCOUNTER — Other Ambulatory Visit: Payer: Self-pay

## 2013-10-07 DIAGNOSIS — D62 Acute posthemorrhagic anemia: Secondary | ICD-10-CM | POA: Diagnosis not present

## 2013-10-07 LAB — BASIC METABOLIC PANEL
BUN: 32 mg/dL — ABNORMAL HIGH (ref 6–23)
Chloride: 100 mEq/L (ref 96–112)
Creatinine, Ser: 1.33 mg/dL — ABNORMAL HIGH (ref 0.50–1.10)
GFR calc non Af Amer: 37 mL/min — ABNORMAL LOW (ref >90)
Glucose, Bld: 122 mg/dL — ABNORMAL HIGH (ref 70–99)
Potassium: 4.1 mEq/L (ref 3.5–5.1)

## 2013-10-07 LAB — HEMOGLOBIN AND HEMATOCRIT, BLOOD
HCT: 32.4 % — ABNORMAL LOW (ref 36.0–46.0)
Hemoglobin: 10.6 g/dL — ABNORMAL LOW (ref 12.0–15.0)

## 2013-10-07 LAB — CBC
HCT: 31.8 % — ABNORMAL LOW (ref 36.0–46.0)
Hemoglobin: 10.4 g/dL — ABNORMAL LOW (ref 12.0–15.0)
MCHC: 32.7 g/dL (ref 30.0–36.0)
MCV: 91.4 fL (ref 78.0–100.0)
RDW: 14.3 % (ref 11.5–15.5)
WBC: 19.2 10*3/uL — ABNORMAL HIGH (ref 4.0–10.5)

## 2013-10-07 MED ORDER — OXYCODONE HCL 5 MG PO TABS: ORAL_TABLET | ORAL | Status: DC

## 2013-10-07 MED ORDER — RIVAROXABAN 10 MG PO TABS: 10.0000 mg | ORAL_TABLET | Freq: Every day | ORAL | Status: DC

## 2013-10-07 MED ORDER — SODIUM CHLORIDE 0.9 % IV BOLUS (SEPSIS)
500.0000 mL | Freq: Once | INTRAVENOUS | Status: AC
  Administered 2013-10-07: 15:00:00 500 mL via INTRAVENOUS

## 2013-10-07 MED ORDER — OXYCODONE HCL 5 MG PO TABS: 5.0000 mg | ORAL_TABLET | ORAL | Status: DC | PRN

## 2013-10-07 MED ORDER — METHOCARBAMOL 500 MG PO TABS: 500.0000 mg | ORAL_TABLET | Freq: Four times a day (QID) | ORAL | Status: DC | PRN

## 2013-10-07 MED ORDER — FERROUS SULFATE 325 (65 FE) MG PO TABS: 325.0000 mg | ORAL_TABLET | Freq: Three times a day (TID) | ORAL | Status: DC

## 2013-10-07 MED ORDER — POLYETHYLENE GLYCOL 3350 17 G PO PACK: 17.0000 g | PACK | Freq: Every day | ORAL | Status: DC | PRN

## 2013-10-07 NOTE — Progress Notes (Signed)
Physical Therapy Treatment Patient Details Name: Shelia Gardner MRN: [id number] DOB: 1930-05-02 Today's Date: 10/07/2013 Time: 1027-2536 PT Time Calculation (min): 23 min  PT Assessment / Plan / Recommendation  History of Present Illness Pt is an 79 y/o female s/p R TKA.   PT Comments   Performed R TKA exercises with assist. **  Follow Up Recommendations  SNF     Does the patient have the potential to tolerate intense rehabilitation     Barriers to Discharge        Equipment Recommendations  None recommended by PT    Recommendations for Other Services    Frequency 7X/week   Progress towards PT Goals    Plan Current plan remains appropriate    Precautions / Restrictions Precautions Precautions: Fall Required Braces or Orthoses: Knee Immobilizer - Right Restrictions Weight Bearing Restrictions: Yes RLE Weight Bearing: Weight bearing as tolerated   Pertinent Vitals/Pain **7/10 R knee Ice applied*       Exercises Total Joint Exercises Ankle Circles/Pumps: AROM;Both;20 reps Quad Sets: AROM;Both;10 reps;Supine Towel Squeeze: AROM;Both;10 reps;Supine Short Arc Quad: AAROM;Right;10 reps;Supine Heel Slides: AAROM;Right;Supine;10 reps Hip ABduction/ADduction: AAROM;Right;10 reps;Supine Straight Leg Raises: AAROM;Right;10 reps;Supine Goniometric ROM: R knee flexion 30 * AAROM, limited by pain   PT Diagnosis:    PT Problem List:   PT Treatment Interventions:     PT Goals (current goals can now be found in the care plan section) Acute Rehab PT Goals Patient Stated Goal: to walk without the walker PT Goal Formulation: With patient Time For Goal Achievement: 10/20/13 Potential to Achieve Goals: Good  Visit Information  Last PT Received On: 10/07/13 Assistance Needed: +2 History of Present Illness: Pt is an 79 y/o female s/p R TKA.    Subjective Data  Patient Stated Goal: to walk without the walker   Cognition  Cognition Arousal/Alertness:  Awake/alert Behavior During Therapy: WFL for tasks assessed/performed Overall Cognitive Status: Within Functional Limits for tasks assessed    Balance     End of Session PT - End of Session Equipment Utilized During Treatment: Oxygen Activity Tolerance: Patient limited by pain Patient left: with call bell/phone within reach;in bed Nurse Communication: Mobility status   GP     Shelia Gardner 10/07/2013, 1:54 PM [phone]

## 2013-10-07 NOTE — Care Management Note (Addendum)
    Page 1 of 1   10/07/2013     3:49:28 PM   CARE MANAGEMENT NOTE 10/07/2013  Patient:  Shelia Gardner, Shelia Gardner   Account Number:  [id number]  Date Initiated:  10/07/2013  Documentation initiated by:  Colleen Can  Subjective/Objective Assessment:   dx total right knee replacemnt     Action/Plan:   SNF rehab   Anticipated DC Date:  10/08/2013   Anticipated DC Plan:  SKILLED NURSING FACILITY  In-house referral  Clinical Social Worker      DC Planning Services  CM consult      Choice offered to / List presented to:             Status of service:  Completed, signed off Medicare Important Message given?   (If response is "NO", the following Medicare IM given date fields will be blank) Date Medicare IM given:   Date Additional Medicare IM given:    Discharge Disposition:    Per UR Regulation:    If discussed at Long Length of Stay Meetings, dates discussed:    Comments:

## 2013-10-07 NOTE — Progress Notes (Signed)
CSW assisting with d/c planning. Pt is planning to d/c to [facility] on SAT if stable for d/c. Tentative d/c summary has been sent to SNF. Week end CSW will assist with d/c planning to SNF.  Cori Razor LCSW [phone]

## 2013-10-07 NOTE — Progress Notes (Signed)
Subjective: 2 Days Post-Op Procedure(s) (LRB): TOTAL RIGHT  KNEE ARTHROPLASTY (Right) Patient reports pain as 3 on 0-10 scale.    Objective: Vital signs in last 24 hours: Temp:  [98.2 F (36.8 C)-98.6 F (37 C)] 98.2 F (36.8 C) (10/31 0541) Pulse Rate:  [75-87] 87 (10/31 0541) Resp:  [9-18] 16 (10/31 0541) BP: (94-147)/(54-83) 147/66 mmHg (10/31 0541) SpO2:  [93 %-98 %] 93 % (10/31 0541) Weight:  [113.399 kg (250 lb)] 113.399 kg (250 lb) (10/30 1915)  Intake/Output from previous day: 10/30 0701 - 10/31 0700 In: 1910 [P.O.:1350; I.V.:560] Out: 2250 [Urine:2250] Intake/Output this shift:     Recent Labs  10/06/13 0343 10/07/13 0447  HGB 11.3* 10.4*    Recent Labs  10/06/13 0343 10/07/13 0447  WBC 20.3* 19.2*  RBC 3.73* 3.48*  HCT 33.5* 31.8*  PLT 235 239    Recent Labs  10/06/13 0343 10/07/13 0447  NA 135 135  K 4.3 4.1  CL 101 100  CO2 24 27  BUN 19 32*  CREATININE 1.05 1.33*  GLUCOSE 157* 122*  CALCIUM 9.0 9.0   No results found for this basename: LABPT, INR,  in the last 72 hours  Neurologically intact Compartment soft  Assessment/Plan: 2 Days Post-Op Procedure(s) (LRB): TOTAL RIGHT  KNEE ARTHROPLASTY (Right) Up with therapy discontinued to [employer].  Ainsley Deakins A 10/07/2013, 7:29 AM

## 2013-10-07 NOTE — Progress Notes (Signed)
Physical Therapy Treatment Patient Details Name: Shelia Gardner MRN: [id number] DOB: 02-Jul-1930 Today's Date: 10/07/2013 Time: 1115-1140 PT Time Calculation (min): 25 min  PT Assessment / Plan / Recommendation  History of Present Illness Pt is an 79 y/o female s/p R TKA.   PT Comments   *Progressing with mobility. Decreased assist required today. Able to take a few steps. Performed TKR exercises with assist. **  Follow Up Recommendations  SNF     Does the patient have the potential to tolerate intense rehabilitation     Barriers to Discharge        Equipment Recommendations  None recommended by PT    Recommendations for Other Services    Frequency 7X/week   Progress towards PT Goals    Plan Current plan remains appropriate    Precautions / Restrictions Precautions Precautions: Fall Required Braces or Orthoses: Knee Immobilizer - Right Restrictions Weight Bearing Restrictions: Yes RLE Weight Bearing: Weight bearing as tolerated   Pertinent Vitals/Pain *SaO2 77% on RA, applied O2 7/10 R knee pain Premedicated, ice applied**    Mobility  Bed Mobility Bed Mobility: Sit to Supine Sit to Supine: 3: Mod assist Details for Bed Mobility Assistance: assist for BLEs into bed Transfers Transfers: Sit to Stand;Stand to Sit;Stand Pivot Transfers Sit to Stand: 1: +2 Total assist;With upper extremity assist;From chair/3-in-1 Sit to Stand: Patient Percentage: 60% Stand to Sit: 1: +2 Total assist;To chair/3-in-1;With upper extremity assist;With armrests Stand to Sit: Patient Percentage: 60% Stand Pivot Transfers: 1: +2 Total assist Stand Pivot Transfers: Patient Percentage: 40% Details for Transfer Assistance: +2 asssist due to pt reporting bil LE's "give out" during ambulation and increased pain with mobility. VC's for hand placement.  SPT  recliner to [facility] with RW.  Pt required increased time during all mobility due to fatigue and RLE pain. Ambulation/Gait Ambulation/Gait  Assistance: 1: +2 Total assist Ambulation/Gait: Patient Percentage: 80% Ambulation Distance (Feet): 4 Feet Assistive device: Rolling walker Ambulation/Gait Assistance Details: 4' back wards to back up to bed Gait Pattern: Step-to pattern;Decreased step length - right;Decreased step length - left;Antalgic;Trunk flexed    Exercises Total Joint Exercises Ankle Circles/Pumps: AROM;Both;20 reps Quad Sets: AROM;Both;10 reps;Supine Short Arc Quad: AAROM;Right;10 reps;Supine Heel Slides: AAROM;Right;Supine;10 reps Straight Leg Raises: AAROM;Right;10 reps;Supine Goniometric ROM: R knee flexion 30* AAROM   PT Diagnosis:    PT Problem List:   PT Treatment Interventions:     PT Goals (current goals can now be found in the care plan section)    Visit Information  Last PT Received On: 10/07/13 Assistance Needed: +2 History of Present Illness: Pt is an 79 y/o female s/p R TKA.    Subjective Data      Cognition  Cognition Arousal/Alertness: Awake/alert Behavior During Therapy: WFL for tasks assessed/performed Overall Cognitive Status: Within Functional Limits for tasks assessed    Balance     End of Session PT - End of Session Equipment Utilized During Treatment: Oxygen Activity Tolerance: Patient limited by pain Patient left: with call bell/phone within reach;in bed Nurse Communication: Mobility status   GP     [name] 10/07/2013, 12:35 PM [phone]

## 2013-10-07 NOTE — Discharge Summary (Signed)
Physician Discharge Summary   Patient ID: Shelia Gardner MRN: [id number] DOB/AGE: 07/10/1930 79 y.o.  Admit date: 10/05/2013 Discharge date: 10/08/2013  Primary Diagnosis: Osteoarthritis, right knee   Admission Diagnoses:  Past Medical History  Diagnosis Date  . Hypertension   . Shortness of breath     with exertion  . GERD (gastroesophageal reflux disease)   . 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 Arthritis    Discharge Diagnoses:   Active Problems:   Osteoarthritis of left knee   Postoperative anemia due to acute blood loss  Estimated body mass index is 45.71 kg/(m^2) as calculated from the following:   Height as of this encounter: 5\' 2"  (1.575 m).   Weight as of this encounter: 113.399 kg (250 lb).  Procedure:  Procedure(s) (LRB): TOTAL RIGHT  KNEE ARTHROPLASTY (Right)   Consults: None  HPI: Shelia Gardner, 79 y.o. female, has a history of pain and functional disability in the right knee due to arthritis and has failed non-surgical conservative treatments for greater than 12 weeks to includeNSAID's and/or analgesics, corticosteriod injections, use of assistive devices, weight reduction as appropriate and activity modification. Onset of symptoms was gradual, starting >10 years ago with gradually worsening course since that time. The patient noted no past surgery on the right knee(s). Patient currently rates pain in the right knee(s) at 7 out of 10 with activity. Patient has night pain, worsening of pain with activity and weight bearing, pain that interferes with activities of daily living, pain with passive range of motion, crepitus and joint swelling. Patient has evidence of periarticular osteophytes and joint space narrowing by imaging studies. There is no active infection.     Laboratory Data: Admission on 10/05/2013  Component Date Value Range Status  . WBC 10/06/2013 20.3* 4.0 - 10.5 K/uL Final  . RBC 10/06/2013 3.73* 3.87 - 5.11  MIL/uL Final  . Hemoglobin 10/06/2013 11.3* 12.0 - 15.0 g/dL Final  . HCT 40/98/1191 33.5* 36.0 - 46.0 % Final  . MCV 10/06/2013 89.8  78.0 - 100.0 fL Final  . MCH 10/06/2013 30.3  26.0 - 34.0 pg Final  . MCHC 10/06/2013 33.7  30.0 - 36.0 g/dL Final  . RDW 47/82/9562 14.1  11.5 - 15.5 % Final  . Platelets 10/06/2013 235  150 - 400 K/uL Final  . Sodium 10/06/2013 135  135 - 145 mEq/L Final  . Potassium 10/06/2013 4.3  3.5 - 5.1 mEq/L Final  . Chloride 10/06/2013 101  96 - 112 mEq/L Final  . CO2 10/06/2013 24  19 - 32 mEq/L Final  . Glucose, Bld 10/06/2013 157* 70 - 99 mg/dL Final  . BUN 13/07/6577 19  6 - 23 mg/dL Final  . Creatinine, Ser 10/06/2013 1.05  0.50 - 1.10 mg/dL Final  . Calcium 46/96/2952 9.0  8.4 - 10.5 mg/dL Final  . GFR calc non Af Amer 10/06/2013 49* >90 mL/min Final  . GFR calc Af Amer 10/06/2013 57* >90 mL/min Final   Comment: (NOTE)                          The eGFR has been calculated using the CKD EPI equation.                          This calculation has not been validated in all clinical situations.  eGFR's persistently <90 mL/min signify possible Chronic Kidney                          Disease.  . WBC 10/07/2013 19.2* 4.0 - 10.5 K/uL Final  . RBC 10/07/2013 3.48* 3.87 - 5.11 MIL/uL Final  . Hemoglobin 10/07/2013 10.4* 12.0 - 15.0 g/dL Final  . HCT 65/78/4696 31.8* 36.0 - 46.0 % Final  . MCV 10/07/2013 91.4  78.0 - 100.0 fL Final  . MCH 10/07/2013 29.9  26.0 - 34.0 pg Final  . MCHC 10/07/2013 32.7  30.0 - 36.0 g/dL Final  . RDW 29/52/8413 14.3  11.5 - 15.5 % Final  . Platelets 10/07/2013 239  150 - 400 K/uL Final  . Sodium 10/07/2013 135  135 - 145 mEq/L Final  . Potassium 10/07/2013 4.1  3.5 - 5.1 mEq/L Final  . Chloride 10/07/2013 100  96 - 112 mEq/L Final  . CO2 10/07/2013 27  19 - 32 mEq/L Final  . Glucose, Bld 10/07/2013 122* 70 - 99 mg/dL Final  . BUN 24/40/1027 32* 6 - 23 mg/dL Final   Comment: DELTA CHECK NOTED                           REPEATED TO VERIFY  . Creatinine, Ser 10/07/2013 1.33* 0.50 - 1.10 mg/dL Final  . Calcium 25/36/6440 9.0  8.4 - 10.5 mg/dL Final  . GFR calc non Af Amer 10/07/2013 37* >90 mL/min Final  . GFR calc Af Amer 10/07/2013 43* >90 mL/min Final   Comment: (NOTE)                          The eGFR has been calculated using the CKD EPI equation.                          This calculation has not been validated in all clinical situations.                          eGFR's persistently <90 mL/min signify possible Chronic Kidney                          Disease.  Hospital Outpatient Visit on 09/29/2013  Component Date Value Range Status  . WBC 09/29/2013 11.2* 4.0 - 10.5 K/uL Final  . RBC 09/29/2013 4.79  3.87 - 5.11 MIL/uL Final  . Hemoglobin 09/29/2013 14.1  12.0 - 15.0 g/dL Final  . HCT 34/74/2595 43.6  36.0 - 46.0 % Final  . MCV 09/29/2013 91.0  78.0 - 100.0 fL Final  . MCH 09/29/2013 29.4  26.0 - 34.0 pg Final  . MCHC 09/29/2013 32.3  30.0 - 36.0 g/dL Final  . RDW 63/87/5643 14.2  11.5 - 15.5 % Final  . Platelets 09/29/2013 312  150 - 400 K/uL Final  . MRSA, PCR 09/29/2013 NEGATIVE  NEGATIVE Final  . Staphylococcus aureus 09/29/2013 NEGATIVE  NEGATIVE Final   Comment:                                 The Xpert SA Assay (FDA  approved for NASAL specimens                          in patients over 61 years of age),                          is one component of                          a comprehensive surveillance                          program.  Test performance has                          been validated by [employer] for patients greater                          than or equal to 32 year old.                          It is not intended                          to diagnose infection nor to                          guide or monitor treatment.  Marland Kitchen aPTT 09/29/2013 31  24 - 37 seconds Final  . Sodium 09/29/2013 140  135 - 145 mEq/L Final    . Potassium 09/29/2013 4.8  3.5 - 5.1 mEq/L Final  . Chloride 09/29/2013 103  96 - 112 mEq/L Final  . CO2 09/29/2013 25  19 - 32 mEq/L Final  . Glucose, Bld 09/29/2013 126* 70 - 99 mg/dL Final  . BUN 96/03/5408 22  6 - 23 mg/dL Final  . Creatinine, Ser 09/29/2013 1.20* 0.50 - 1.10 mg/dL Final  . Calcium 81/19/1478 10.7* 8.4 - 10.5 mg/dL Final  . Total Protein 09/29/2013 7.7  6.0 - 8.3 g/dL Final  . Albumin 29/56/2130 4.1  3.5 - 5.2 g/dL Final  . AST 86/57/8469 23  0 - 37 U/L Final  . ALT 09/29/2013 19  0 - 35 U/L Final  . Alkaline Phosphatase 09/29/2013 84  39 - 117 U/L Final  . Total Bilirubin 09/29/2013 0.4  0.3 - 1.2 mg/dL Final  . GFR calc non Af Amer 09/29/2013 42* >90 mL/min Final  . GFR calc Af Amer 09/29/2013 48* >90 mL/min Final   Comment: (NOTE)                          The eGFR has been calculated using the CKD EPI equation.                          This calculation has not been validated in all clinical situations.                          eGFR's persistently <90 mL/min signify possible Chronic Kidney  Disease.  Marland Kitchen Prothrombin Time 09/29/2013 12.8  11.6 - 15.2 seconds Final  . INR 09/29/2013 0.98  0.00 - 1.49 Final  . ABO/RH(D) 09/29/2013 O POS   Final  . Antibody Screen 09/29/2013 NEG   Final  . Sample Expiration 09/29/2013 10/08/2013   Final  . Color, Urine 09/29/2013 YELLOW  YELLOW Final  . APPearance 09/29/2013 CLEAR  CLEAR Final  . Specific Gravity, Urine 09/29/2013 1.015  1.005 - 1.030 Final  . pH 09/29/2013 6.0  5.0 - 8.0 Final  . Glucose, UA 09/29/2013 NEGATIVE  NEGATIVE mg/dL Final  . Hgb urine dipstick 09/29/2013 NEGATIVE  NEGATIVE Final  . Bilirubin Urine 09/29/2013 NEGATIVE  NEGATIVE Final  . Ketones, ur 09/29/2013 NEGATIVE  NEGATIVE mg/dL Final  . Protein, ur 16/09/9603 NEGATIVE  NEGATIVE mg/dL Final  . Urobilinogen, UA 09/29/2013 0.2  0.0 - 1.0 mg/dL Final  . Nitrite 54/08/8118 NEGATIVE  NEGATIVE Final  . Leukocytes, UA 09/29/2013  Gardner* NEGATIVE Final  . ABO/RH(D) 09/29/2013 O POS   Final  . Specimen Description 09/29/2013 URINE, CLEAN CATCH   Final  . Special Requests 09/29/2013 NONE   Final  . Culture  Setup Time 09/29/2013    Final                   Value:09/29/2013 21:17                         Performed at [facility]  . Colony Count 09/29/2013    Final                   Value:NO GROWTH                         Performed at [facility]  . Culture 09/29/2013    Final                   Value:NO GROWTH                         Performed at [facility]  . Report Status 09/29/2013 09/30/2013 FINAL   Final  . Squamous Epithelial / LPF 09/29/2013 FEW* RARE Final  . WBC, UA 09/29/2013 7-10  <3 WBC/hpf Final  . Bacteria, UA 09/29/2013 FEW* RARE Final     X-Rays:Dg Chest 2 View  09/29/2013   CLINICAL DATA:  Hypertension.  EXAM: CHEST  2 VIEW  COMPARISON:  May 09, 2013.  FINDINGS: The heart size and mediastinal contours are within normal limits. Both lungs are clear. Large anterior osteophyte is seen in mid thoracic spine. Degenerative joint disease of both acromioclavicular joints is noted.  IMPRESSION: No active cardiopulmonary disease.   Electronically Signed   By: Roque Lias M.D.   On: 09/29/2013 13:09   Dg Knee Right Port  10/05/2013   CLINICAL DATA:  Postop right total knee arthroplasty.  EXAM: PORTABLE RIGHT KNEE - 1-2 VIEW  COMPARISON:  06/02/2011.  FINDINGS: The right knee demonstrates a total knee arthroplasty without evidence of hardware failure complication. There is no significant joint effusion. There is no fracture or dislocation. The alignment is anatomic. Surgical drains are present. Post-surgical changes noted in the surrounding soft tissues.  IMPRESSION: Right total knee arthroplasty.   Electronically Signed   By: Elige Ko   On: 10/05/2013 10:57    EKG: Orders placed during the hospital encounter of 10/05/13  .  EKG 12-LEAD  . EKG 12-LEAD     Hospital Course:  Shelia Gardner is a 79 y.o. who was admitted to [facility]. They were brought to the operating room on 10/05/2013 and underwent Procedure(s): TOTAL RIGHT  KNEE ARTHROPLASTY.  Patient tolerated the procedure well and was later transferred to the recovery room and then to the orthopaedic floor for postoperative care.  They were given PO and IV analgesics for pain control following their surgery.  They were given 24 hours of postoperative antibiotics of  Anti-infectives   Start     Dose/Rate Route Frequency Ordered Stop   10/05/13 1400  ceFAZolin (ANCEF) IVPB 1 g/50 mL premix     1 g 100 mL/hr over 30 Minutes Intravenous Every 6 hours 10/05/13 1238 10/05/13 2038   10/05/13 0824  polymyxin B 500,000 Units, bacitracin 50,000 Units in sodium chloride irrigation 0.9 % 500 mL irrigation  Status:  Discontinued       As needed 10/05/13 0824 10/05/13 1009   10/05/13 0527  ceFAZolin (ANCEF) IVPB 2 g/50 mL premix     2 g 100 mL/hr over 30 Minutes Intravenous On call to O.R. 10/05/13 1610 10/05/13 0759     and started on DVT prophylaxis in the form of Xarelto.   PT and OT were ordered for total joint protocol.  Discharge planning consulted to help with postop disposition and equipment needs.  Patient had a poor night on the evening of surgery due to inability to sleep.  They started to get up OOB with therapy on day one. Hemovac drain was pulled without difficulty.  She was transferred from stepdown to the orthopedic floor as her oxygen sats were much improved. Continued to work with therapy into day two.  Dressing was changed on day two and the incision was clean and dry.  By day three, the patient had progressed with therapy and meeting their goals.  Incision was healing well.  Patient was seen in rounds and was ready to go to SNF.   Discharge Medications: Prior to Admission medications   Medication Sig Start Date End Date Taking? Authorizing Provider  albuterol (PROVENTIL HFA;VENTOLIN HFA) 108  (90 BASE) MCG/ACT inhaler Inhale 2 puffs into the lungs every 6 (six) hours as needed for wheezing.   Yes Historical Provider, MD  acetaminophen (TYLENOL) 500 MG tablet Take 1,000 mg by mouth every 6 (six) hours as needed for pain.    Historical Provider, MD  amLODipine (NORVASC) 2.5 MG tablet Take 2.5 mg by mouth every morning.    Historical Provider, MD  ferrous sulfate 325 (65 FE) MG tablet Take 1 tablet (325 mg total) by mouth 3 (three) times daily after meals. 10/07/13   Lei Dower Tamala Ser, PA-C  fexofenadine (ALLEGRA) 180 MG tablet Take 180 mg by mouth daily.    Historical Provider, MD  hydrochlorothiazide (HYDRODIURIL) 25 MG tablet Take 25 mg by mouth every morning.    Historical Provider, MD  ketoconazole (NIZORAL) 2 % cream Apply 1 application topically daily.    Historical Provider, MD  lisinopril (PRINIVIL,ZESTRIL) 10 MG tablet Take 5 mg by mouth every morning. TAKES 1/2 TABLET    Historical Provider, MD  methocarbamol (ROBAXIN) 500 MG tablet Take 1 tablet (500 mg total) by mouth every 6 (six) hours as needed. 10/07/13   Kathren Scearce Tamala Ser, PA-C  oxyCODONE (ROXICODONE) 5 MG immediate release tablet Take 1-3 tablets (5-15 mg total) by mouth every 4 (four) hours as needed for pain. 10/07/13  Danyela Posas Tamala Ser, PA-C  polyethylene glycol (MIRALAX / GLYCOLAX) packet Take 17 g by mouth daily as needed. 10/07/13   Willliam Pettet Tamala Ser, PA-C  rivaroxaban (XARELTO) 10 MG TABS tablet Take 1 tablet (10 mg total) by mouth daily with breakfast. 10/07/13   Sharifah Champine Tamala Ser, PA-C    Diet: Regular diet Activity:WBAT Follow-up:in 2 weeks Disposition - Skilled nursing facility [facility] Discharged Condition: stable   Discharge Orders   Future Orders Complete By Expires   Call MD / Call 911  As directed    Comments:     If you experience chest pain or shortness of breath, CALL 911 and be transported to the hospital emergency room.  If you develope a fever above 101 F, pus  (white drainage) or increased drainage or redness at the wound, or calf pain, call your surgeon's office.   Constipation Prevention  As directed    Comments:     Drink plenty of fluids.  Prune juice may be helpful.  You may use a stool softener, such as Colace (over the counter) 100 mg twice a day.  Use MiraLax (over the counter) for constipation as needed.   Diet - low sodium heart healthy  As directed    Discharge instructions  As directed    Comments:     Walk with your walker. Weight bearing as instructed. Do not change your dressing unless there is excess draiange Shower only, no tub bath. Call if any temperatures greater than 101 or any wound complications: [phone] during the day and ask for Dr. Jeannetta Ellis nurse, Mac[facility] [place].   Do not put a pillow under the knee. Place it under the heel.  As directed    Driving restrictions  As directed    Comments:     No driving   Increase activity slowly as tolerated  As directed        Medication List    STOP taking these medications       beta carotene w/minerals tablet     BIOTIN 5000 5 MG Caps  Generic drug:  Biotin     calcium citrate-vitamin D 315-200 MG-UNIT per tablet  Commonly known as:  CITRACAL+D     fish oil-omega-3 fatty acids 1000 MG capsule     multivitamin with minerals Tabs tablet     pantoprazole 40 MG tablet  Commonly known as:  PROTONIX      TAKE these medications       acetaminophen 500 MG tablet  Commonly known as:  TYLENOL  Take 1,000 mg by mouth every 6 (six) hours as needed for pain.     albuterol 108 (90 BASE) MCG/ACT inhaler  Commonly known as:  PROVENTIL HFA;VENTOLIN HFA  Inhale 2 puffs into the lungs every 6 (six) hours as needed for wheezing.     amLODipine 2.5 MG tablet  Commonly known as:  NORVASC  Take 2.5 mg by mouth every morning.     ferrous sulfate 325 (65 FE) MG tablet  Take 1 tablet (325 mg total) by mouth 3 (three) times daily after meals.     fexofenadine 180 MG tablet    Commonly known as:  ALLEGRA  Take 180 mg by mouth daily.     hydrochlorothiazide 25 MG tablet  Commonly known as:  HYDRODIURIL  Take 25 mg by mouth every morning.     ketoconazole 2 % cream  Commonly known as:  NIZORAL  Apply 1 application topically daily.     lisinopril 10  MG tablet  Commonly known as:  PRINIVIL,ZESTRIL  Take 5 mg by mouth every morning. TAKES 1/2 TABLET     methocarbamol 500 MG tablet  Commonly known as:  ROBAXIN  Take 1 tablet (500 mg total) by mouth every 6 (six) hours as needed.     oxyCODONE 5 MG immediate release tablet  Commonly known as:  ROXICODONE  Take 1-3 tablets (5-15 mg total) by mouth every 4 (four) hours as needed for pain.     polyethylene glycol packet  Commonly known as:  MIRALAX / GLYCOLAX  Take 17 g by mouth daily as needed.     rivaroxaban 10 MG Tabs tablet  Commonly known as:  XARELTO  Take 1 tablet (10 mg total) by mouth daily with breakfast.           Follow-up Information   Follow up with GIOFFRE,RONALD A, MD. Schedule an appointment as soon as possible for a visit in 2 weeks.   Specialty:  Orthopedic Surgery   Contact information:   [address] [place] [address] [phone]       Signed: Dimitri Ped [facility] 10/07/2013, 7:27 AM

## 2013-10-07 NOTE — Progress Notes (Signed)
Dimitri Ped, [place]  notified patients B/P 83/47; H&H, 500 cc NS bolus and discontinuation of all B/P medications ordered.  RN continuing to [facility].  Results of H&H called to [facility].

## 2013-10-08 DIAGNOSIS — D62 Acute posthemorrhagic anemia: Secondary | ICD-10-CM | POA: Diagnosis not present

## 2013-10-08 DIAGNOSIS — R509 Fever, unspecified: Secondary | ICD-10-CM | POA: Diagnosis not present

## 2013-10-08 DIAGNOSIS — Z79899 Other long term (current) drug therapy: Secondary | ICD-10-CM | POA: Diagnosis not present

## 2013-10-08 DIAGNOSIS — M4800 Spinal stenosis, site unspecified: Secondary | ICD-10-CM | POA: Diagnosis not present

## 2013-10-08 DIAGNOSIS — R269 Unspecified abnormalities of gait and mobility: Secondary | ICD-10-CM | POA: Diagnosis not present

## 2013-10-08 DIAGNOSIS — R279 Unspecified lack of coordination: Secondary | ICD-10-CM | POA: Diagnosis not present

## 2013-10-08 DIAGNOSIS — Z96659 Presence of unspecified artificial knee joint: Secondary | ICD-10-CM | POA: Diagnosis not present

## 2013-10-08 DIAGNOSIS — K5900 Constipation, unspecified: Secondary | ICD-10-CM | POA: Diagnosis not present

## 2013-10-08 DIAGNOSIS — J449 Chronic obstructive pulmonary disease, unspecified: Secondary | ICD-10-CM | POA: Diagnosis not present

## 2013-10-08 DIAGNOSIS — D649 Anemia, unspecified: Secondary | ICD-10-CM | POA: Diagnosis not present

## 2013-10-08 DIAGNOSIS — M1990 Unspecified osteoarthritis, unspecified site: Secondary | ICD-10-CM | POA: Diagnosis not present

## 2013-10-08 DIAGNOSIS — Z471 Aftercare following joint replacement surgery: Secondary | ICD-10-CM | POA: Diagnosis not present

## 2013-10-08 DIAGNOSIS — I10 Essential (primary) hypertension: Secondary | ICD-10-CM | POA: Diagnosis not present

## 2013-10-08 DIAGNOSIS — M6281 Muscle weakness (generalized): Secondary | ICD-10-CM | POA: Diagnosis not present

## 2013-10-08 DIAGNOSIS — Z853 Personal history of malignant neoplasm of breast: Secondary | ICD-10-CM | POA: Diagnosis not present

## 2013-10-08 DIAGNOSIS — J189 Pneumonia, unspecified organism: Secondary | ICD-10-CM | POA: Diagnosis not present

## 2013-10-08 LAB — CBC
HCT: 30.7 % — ABNORMAL LOW (ref 36.0–46.0)
Hemoglobin: 9.9 g/dL — ABNORMAL LOW (ref 12.0–15.0)
MCH: 29.6 pg (ref 26.0–34.0)
MCV: 91.6 fL (ref 78.0–100.0)
RBC: 3.35 MIL/uL — ABNORMAL LOW (ref 3.87–5.11)
WBC: 14.2 10*3/uL — ABNORMAL HIGH (ref 4.0–10.5)

## 2013-10-08 MED ORDER — OXYCODONE HCL 5 MG PO TABS: ORAL_TABLET | ORAL | Status: DC

## 2013-10-08 NOTE — Progress Notes (Signed)
Physical Therapy Treatment Patient Details Name: Shelia Gardner MRN: [id number] DOB: 1930/11/01 Today's Date: 10/08/2013 Time: 1324-4010 PT Time Calculation (min): 28 min  PT Assessment / Plan / Recommendation  History of Present Illness Pt is an 79 y/o female s/p R TKA.   PT Comments   POD # 3 assisted pt OOB to BSC to void then amb in hallway.  Pt demon MAX anxiety and required increased time and VC's to complete task.     Follow Up Recommendations  SNF     Does the patient have the potential to tolerate intense rehabilitation     Barriers to Discharge        Equipment Recommendations       Recommendations for Other Services    Frequency 7X/week   Progress towards PT Goals Progress towards PT goals: Progressing toward goals  Plan      Precautions / Restrictions Precautions Precautions: Fall Required Braces or Orthoses: Knee Immobilizer - Right Restrictions Weight Bearing Restrictions: No RLE Weight Bearing: Weight bearing as tolerated    Pertinent Vitals/Pain C/o "right much" meds requested    Mobility  Bed Mobility Bed Mobility: Supine to Sit Supine to Sit: 1: +1 Total assist Supine to Sit: Patient Percentage: 50% Details for Bed Mobility Assistance: increased time and assist with R LE and upper body to rise Transfers Transfers: Sit to Stand;Stand to Sit Sit to Stand: 2: Max assist;From bed;From elevated surface Stand to Sit: 2: Max assist Details for Transfer Assistance: 50% VC's on proper tech and hand placement plus increased time to complete. Assisted pt from elevated bed to [facility] then to standing for amb.   Ambulation/Gait Ambulation/Gait Assistance: 3: Mod assist Ambulation Distance (Feet): 6 Feet Ambulation/Gait Assistance Details: 75% VC's on proper sequencing and proper advancement of RW.  Pt demon MAX anxiety and required positive reinforcement to decrease anxiety/fear as pt repeated "I can't do this". Gait Pattern: Step-to pattern;Decreased  step length - right;Decreased step length - left;Antalgic;Trunk flexed Gait velocity: decreased     PT Goals (current goals can now be found in the care plan section)    Visit Information  Last PT Received On: 10/08/13 Assistance Needed: +2 History of Present Illness: Pt is an 79 y/o female s/p R TKA.    Subjective Data      Cognition       Balance     End of Session PT - End of Session Equipment Utilized During Treatment: Gait belt Activity Tolerance: Patient limited by fatigue Patient left: in chair;with call bell/phone within reach   Felecia Shelling  PTA [facility]  Acute  Rehab Pager      [phone]

## 2013-10-08 NOTE — Progress Notes (Signed)
Subjective: 3 Days Post-Op Procedure(s) (LRB): TOTAL RIGHT  KNEE ARTHROPLASTY (Right) Patient reports pain as mild.  SNF discharge pending for today.  Objective: Vital signs in last 24 hours: Temp:  [98.3 F (36.8 C)-98.7 F (37.1 C)] 98.5 F (36.9 C) (11/01 0610) Pulse Rate:  [88-91] 88 (11/01 0610) Resp:  [16] 16 (11/01 0610) BP: (83-119)/(47-73) 115/71 mmHg (11/01 0610) SpO2:  [92 %-98 %] 98 % (11/01 0610)  Intake/Output from previous day: 10/31 0701 - 11/01 0700 In: 2700 [P.O.:1200; I.V.:1000; IV Piggyback:500] Out: 2050 [Urine:2050] Intake/Output this shift:     Recent Labs  10/06/13 0343 10/07/13 0447 10/07/13 1442 10/08/13 0443  HGB 11.3* 10.4* 10.6* 9.9*    Recent Labs  10/07/13 0447 10/07/13 1442 10/08/13 0443  WBC 19.2*  --  14.2*  RBC 3.48*  --  3.35*  HCT 31.8* 32.4* 30.7*  PLT 239  --  197    Recent Labs  10/06/13 0343 10/07/13 0447  NA 135 135  K 4.3 4.1  CL 101 100  CO2 24 27  BUN 19 32*  CREATININE 1.05 1.33*  GLUCOSE 157* 122*  CALCIUM 9.0 9.0   No results found for this basename: LABPT, INR,  in the last 72 hours   PE:  wn wd woman in nad.  R knee dressed and dry,.  NVI at R foot.  Assessment/Plan: 3 Days Post-Op Procedure(s) (LRB): TOTAL RIGHT  KNEE ARTHROPLASTY (Right) Discharge to SNF  Velda Wendt, [facility] 10/08/2013, 8:03 AM

## 2013-10-08 NOTE — Progress Notes (Signed)
Per MD, Pt ready for d/c.  Notified RN, Pt, family and facility.  Admission arranged yesterday by Weekday CSW.  Facility ready to receive Pt.  Arranged for transportation.  Providence Crosby, LCSWA Clinical Social Work [phone]

## 2013-10-08 NOTE — Progress Notes (Signed)
Report called to [place] at [facility].

## 2013-10-09 NOTE — Progress Notes (Signed)
Clinical Social Work Department CLINICAL SOCIAL WORK PLACEMENT NOTE 10/09/2013  Patient:  UMAIMA, SCHOLTEN  Account Number:  [id number] Admit date:  10/05/2013  Clinical Social Worker:  Cori Razor, LCSW  Date/time:  10/06/2013 04:55 PM  Clinical Social Work is seeking post-discharge placement for this patient at the following level of care:   SKILLED NURSING   (*CSW will update this form in Epic as items are completed)     Patient/family provided with [facility] Department of Clinical Social Work's list of facilities offering this level of care within the geographic area requested by the patient (or if unable, by the patient's family).  10/06/2013  Patient/family informed of their freedom to choose among providers that offer the needed level of care, that participate in Medicare, Medicaid or managed care program needed by the patient, have an available bed and are willing to accept the patient.    Patient/family informed of [facility]' ownership interest in [facility], as well as of the fact that they are under no obligation to receive care at this facility.  PASARR submitted to EDS on 10/06/2013 PASARR number received from [employer] on 10/06/2013  FL2 transmitted to all facilities in geographic area requested by pt/family on  09/29/2013 FL2 transmitted to all facilities within larger geographic area on   Patient informed that his/her managed care company has contracts with or will negotiate with  certain facilities, including the following:     Patient/family informed of bed offers received:  10/06/2013 Patient chooses bed at [facility] Physician recommends and patient chooses bed at    Patient to be transferred to [facility] on  10/08/2013 Patient to be transferred to facility by EMS  The following physician request were entered in Epic:  [place] Daren Yeagle LCSW [phone]   Additional Comments:  Cori Razor LCSW [phone]

## 2013-10-10 ENCOUNTER — Non-Acute Institutional Stay (SKILLED_NURSING_FACILITY): Payer: Medicare Other | Admitting: Adult Health

## 2013-10-10 DIAGNOSIS — K5900 Constipation, unspecified: Secondary | ICD-10-CM | POA: Diagnosis not present

## 2013-10-10 DIAGNOSIS — M1990 Unspecified osteoarthritis, unspecified site: Secondary | ICD-10-CM

## 2013-10-10 DIAGNOSIS — J189 Pneumonia, unspecified organism: Secondary | ICD-10-CM

## 2013-10-10 DIAGNOSIS — D62 Acute posthemorrhagic anemia: Secondary | ICD-10-CM | POA: Diagnosis not present

## 2013-10-10 DIAGNOSIS — I10 Essential (primary) hypertension: Secondary | ICD-10-CM

## 2013-10-11 ENCOUNTER — Other Ambulatory Visit: Payer: Self-pay | Admitting: *Deleted

## 2013-10-12 ENCOUNTER — Non-Acute Institutional Stay (SKILLED_NURSING_FACILITY): Payer: Medicare Other | Admitting: Internal Medicine

## 2013-10-12 DIAGNOSIS — D62 Acute posthemorrhagic anemia: Secondary | ICD-10-CM

## 2013-10-12 DIAGNOSIS — M1990 Unspecified osteoarthritis, unspecified site: Secondary | ICD-10-CM

## 2013-10-12 DIAGNOSIS — J309 Allergic rhinitis, unspecified: Secondary | ICD-10-CM

## 2013-10-12 DIAGNOSIS — I10 Essential (primary) hypertension: Secondary | ICD-10-CM

## 2013-10-14 DIAGNOSIS — M1990 Unspecified osteoarthritis, unspecified site: Secondary | ICD-10-CM | POA: Insufficient documentation

## 2013-10-14 DIAGNOSIS — I10 Essential (primary) hypertension: Secondary | ICD-10-CM | POA: Insufficient documentation

## 2013-10-14 DIAGNOSIS — K5900 Constipation, unspecified: Secondary | ICD-10-CM | POA: Insufficient documentation

## 2013-10-14 NOTE — Progress Notes (Signed)
Patient ID: Shelia Gardner, female   DOB: [birth year], 79 y.o.   MRN: [id number]       PROGRESS NOTE  DATE: 10/10/2013  FACILITY: Nursing Home Location: [facility]  LEVEL OF CARE: SNF (31)  Acute Visit  CHIEF COMPLAINT:  Follow-up hospitalization  HISTORY OF PRESENT ILLNESS: This is an 79 year old female who has been admitted to [facility] on 10/08/13 from [facility] with Osteoarthritis S/P right total knee arthroplasty. She has been a admitted for a short-term rehabilitation.  REASSESSMENT OF ONGOING PROBLEM(S):  HTN: Pt 's HTN remains stable.  Denies CP, sob, DOE, pedal edema, headaches, dizziness or visual disturbances.  No complications from the medications currently being used.  Last BP : 131/66  ANEMIA: The anemia has been stable. The patient denies fatigue, melena or hematochezia. No complications from the medications currently being used. 10/14  hgb 10.4  PNEUMONIA: The pneumonia remains stable.  The patient denies ongoing chest pain, cough, shortness of breath, fever, chills or night sweats. No complications reported from the current antibiotic being used. Currently on Avelox   PAST MEDICAL HISTORY : Reviewed.  No changes.  CURRENT MEDICATIONS: Reviewed per [facility]  REVIEW OF SYSTEMS:  GENERAL: no change in appetite, no fatigue, no weight changes, no fever, chills or weakness RESPIRATORY: no cough, SOB, DOE, wheezing, hemoptysis CARDIAC: no chest pain, edema or palpitations GI: no abdominal pain, diarrhea, constipation, heart burn, nausea or vomiting  PHYSICAL EXAMINATION  VS:  T131/66       P91      RR20      BP131/66     POX95 %     WT257.4 (Lb)  GENERAL: no acute distress, normal body habitus EYES: conjunctivae normal, sclerae normal, normal eye lids NECK: supple, trachea midline, no neck masses, no thyroid tenderness, no thyromegaly LYMPHATICS: no LAN in the neck, no supraclavicular LAN RESPIRATORY: breathing is even & unlabored,  BS CTAB CARDIAC: RRR, no murmur,no extra heart sounds, no edema GI: abdomen soft, normal BS, no masses, no tenderness, no hepatomegaly, no splenomegaly PSYCHIATRIC: the patient is alert & oriented to person, affect & behavior appropriate  LABS/RADIOLOGY: 10/07/13 WBC 19.2 hemoglobin 10.4 hematocrit 31.8 sodium 135 potassium 4.1 glucose 122 BUN 32 creatinine 1.33 calcium 9.0   ASSESSMENT/PLAN:  Osteoarthritis 2 status post right total knee arthroplasty - for rehabilitation  Hypertension - well controlled   Constipation - start Magnesium Citrate 296 mL PO X 1  Anemia - stable  Pneumonia - continue Avelox   CPT CODE: 40981

## 2013-10-17 ENCOUNTER — Other Ambulatory Visit: Payer: Self-pay | Admitting: *Deleted

## 2013-10-17 MED ORDER — OXYCODONE HCL 5 MG PO TABS: ORAL_TABLET | ORAL | Status: DC

## 2013-10-24 ENCOUNTER — Non-Acute Institutional Stay (SKILLED_NURSING_FACILITY): Payer: Medicare Other | Admitting: Adult Health

## 2013-10-24 ENCOUNTER — Other Ambulatory Visit: Payer: Self-pay

## 2013-10-24 DIAGNOSIS — R110 Nausea: Secondary | ICD-10-CM | POA: Insufficient documentation

## 2013-10-24 DIAGNOSIS — R112 Nausea with vomiting, unspecified: Secondary | ICD-10-CM | POA: Insufficient documentation

## 2013-10-24 DIAGNOSIS — K5900 Constipation, unspecified: Secondary | ICD-10-CM

## 2013-10-24 LAB — CBC WITH DIFFERENTIAL/PLATELET
Basophil %: 1.1 %
Eosinophil #: 0.2 10*3/uL (ref 0.0–0.7)
Eosinophil %: 2.1 %
HCT: 38.5 % (ref 35.0–47.0)
Lymphocyte #: 1.2 10*3/uL (ref 1.0–3.6)
MCH: 29.5 pg (ref 26.0–34.0)
Monocyte #: 1.1 x10 3/mm — ABNORMAL HIGH (ref 0.2–0.9)
Monocyte %: 9.2 %
Neutrophil #: 8.9 10*3/uL — ABNORMAL HIGH (ref 1.4–6.5)
Neutrophil %: 77.1 %
Platelet: 377 10*3/uL (ref 150–440)
RBC: 4.27 10*6/uL (ref 3.80–5.20)
RDW: 14 % (ref 11.5–14.5)
WBC: 11.5 10*3/uL — ABNORMAL HIGH (ref 3.6–11.0)

## 2013-10-24 LAB — BASIC METABOLIC PANEL
Anion Gap: 7 (ref 7–16)
BUN: 43 mg/dL — ABNORMAL HIGH (ref 7–18)
Calcium, Total: 9 mg/dL (ref 8.5–10.1)
Chloride: 101 mmol/L (ref 98–107)
Co2: 26 mmol/L (ref 21–32)
Creatinine: 1.13 mg/dL (ref 0.60–1.30)
EGFR (African American): 53 — ABNORMAL LOW
Glucose: 95 mg/dL (ref 65–99)
Sodium: 134 mmol/L — ABNORMAL LOW (ref 136–145)

## 2013-10-24 NOTE — Progress Notes (Signed)
Patient ID: Shelia Gardner, female   DOB: 1930/10/28, 79 y.o.   MRN: [id number]       PROGRESS NOTE  DATE: 10/24/2013  FACILITY:  [facility]  LEVEL OF CARE: SNF (31)  Acute Visit  CHIEF COMPLAINT:  Manage Nausea and Vomiting and Constipation   HISTORY OF PRESENT ILLNESS: This is an 79 year old female who complains of nausea and vomiting. She said," I think I am feeling a little better as compared to yesterday."  Latest hgb 11.3 (10/20/13). Iron was discontinued 2 days ago. Normoactive bowel sounds noted.    PAST MEDICAL HISTORY : Reviewed.  No changes.  CURRENT MEDICATIONS: Reviewed per [facility]  REVIEW OF SYSTEMS:  GENERAL: no change in appetite, no fatigue, no weight changes, no fever, chills or weakness RESPIRATORY: no cough, SOB, DOE,, wheezing, hemoptysis CARDIAC: no chest pain, edema or palpitations GI: no abdominal pain, diarrhea, heart burn, + nausea, +constipation  PHYSICAL EXAMINATION  VS:  T98.2        P84       RR20       BP118/64             WT233.6 (Lb)  GENERAL: no acute distress, normal body habitus NECK: supple, trachea midline, no neck masses, no thyroid tenderness, no thyromegaly LYMPHATICS: no LAN in the neck, no supraclavicular LAN RESPIRATORY: breathing is even & unlabored, BS CTAB CARDIAC: RRR, no murmur,no extra heart sounds, no edema GI: abdomen soft, normal BS, no masses, no tenderness, no hepatomegaly, no splenomegaly PSYCHIATRIC: the patient is alert & oriented to person, affect & behavior appropriate  LABS/RADIOLOGY: 10/20/13  Wbc 14.5  hgb 11.3  hct 36.4  NA 135  K 4.5  Glucose 97  BUN 24  Creatinine 1.1  CA 9.1 10/07/13 WBC 19.2 hemoglobin 10.4 hematocrit 31.8 sodium 135 potassium 4.1 glucose 122 BUN 32 creatinine 1.33 calcium 9.0   ASSESSMENT/PLAN:  Nausea and Vomiting - KUB, BMP and CBC  Constipation - prune juice on trays TID   CPT CODE: 52841

## 2013-10-28 ENCOUNTER — Non-Acute Institutional Stay (SKILLED_NURSING_FACILITY): Payer: Medicare Other | Admitting: Adult Health

## 2013-10-28 DIAGNOSIS — K5900 Constipation, unspecified: Secondary | ICD-10-CM

## 2013-10-28 DIAGNOSIS — M1990 Unspecified osteoarthritis, unspecified site: Secondary | ICD-10-CM

## 2013-10-28 DIAGNOSIS — I10 Essential (primary) hypertension: Secondary | ICD-10-CM

## 2013-10-28 DIAGNOSIS — D62 Acute posthemorrhagic anemia: Secondary | ICD-10-CM

## 2013-10-30 DIAGNOSIS — Z471 Aftercare following joint replacement surgery: Secondary | ICD-10-CM | POA: Diagnosis not present

## 2013-10-30 DIAGNOSIS — N189 Chronic kidney disease, unspecified: Secondary | ICD-10-CM | POA: Diagnosis not present

## 2013-10-30 DIAGNOSIS — I129 Hypertensive chronic kidney disease with stage 1 through stage 4 chronic kidney disease, or unspecified chronic kidney disease: Secondary | ICD-10-CM | POA: Diagnosis not present

## 2013-10-30 DIAGNOSIS — Z96659 Presence of unspecified artificial knee joint: Secondary | ICD-10-CM | POA: Diagnosis not present

## 2013-10-30 DIAGNOSIS — R112 Nausea with vomiting, unspecified: Secondary | ICD-10-CM | POA: Diagnosis not present

## 2013-11-01 NOTE — Progress Notes (Signed)
Patient ID: Shelia Gardner, female   DOB: September 05, 1930, 79 y.o.   MRN: [id number]              PROGRESS NOTE  DATE: 10/28/2013   FACILITY: [facility]  LEVEL OF CARE: SNF (31)  Acute Visit  CHIEF COMPLAINT:  Discharge Notes  HISTORY OF PRESENT ILLNESS: This is an 79 year old female who is for discharge home with Home health PT, OT and Nursing. DME: 3-in-1 commode due to unsteady gait. She has been admitted to [facility] on 10/08/13 from [facility] with Osteoarthritis S/P right total knee arthroplasty. Patient was admitted to this facility for short-term rehabilitation after the patient's recent hospitalization.  Patient has completed SNF rehabilitation and therapy has cleared the patient for discharge.  Reassessment of ongoing problem(s):  HTN: Pt 's HTN remains stable.  Denies CP, sob, DOE, pedal edema, headaches, dizziness or visual disturbances.  No complications from the medications currently being used.  Last BP : 121/59  CONSTIPATION: The constipation remains stable. No complications from the medications presently being used. Patient denies ongoing constipation, abdominal pain, nausea or vomiting.  ANEMIA: The anemia has been stable. The patient denies fatigue, melena or hematochezia. No complications from the medications currently being used. 11/14 hgb 11.3  PAST MEDICAL HISTORY : Reviewed.  No changes.  CURRENT MEDICATIONS: Reviewed per [facility]  REVIEW OF SYSTEMS:  GENERAL: no change in appetite, no fatigue, no weight changes, no fever, chills or weakness RESPIRATORY: no cough, SOB, DOE, wheezing, hemoptysis CARDIAC: no chest pain, edema or palpitations GI: no abdominal pain, diarrhea, constipation, heart burn, nausea or vomiting  PHYSICAL EXAMINATION  VS:  T97.9       P80       RR20      BP121/59      POX98 %       WT233.6 (Lb)  GENERAL: no acute distress, normal body habitus EYES: conjunctivae normal, sclerae normal, normal eye lids NECK:  supple, trachea midline, no neck masses, no thyroid tenderness, no thyromegaly RESPIRATORY: breathing is even & unlabored, BS CTAB CARDIAC: RRR, no murmur,no extra heart sounds, no edema GI: abdomen soft, normal BS, no masses, no tenderness, no hepatomegaly, no splenomegaly PSYCHIATRIC: the patient is alert & oriented to person, affect & behavior appropriate  LABS/RADIOLOGY: 10/20/13  Wbc 14.5  hgb 11.3  hct 36.4  NA 135  K 4.5  Glucose 97  BUN 24  Creatinine 1.1  CA 9.1 10/07/13 WBC 19.2 hemoglobin 10.4 hematocrit 31.8 sodium 135 potassium 4.1 glucose 122 BUN 32 creatinine 1.33 calcium 9.0  ASSESSMENT/PLAN:  Osteoarthritis  status post right total knee arthroplasty - for Home health PT, OT and Nursing  Hypertension - well controlled   Constipation - no complaints  Anemia - stable; Iron discontinued    I have filled out patient's discharge paperwork and written prescriptions.  Patient will receive home health PT, OT and Nursing.  DME provided: 3-in-1 commode  Total discharge time: Greater than 30 minutes Discharge time involved coordination of the discharge process with social worker, nursing staff and therapy department. Medical justification for home health services/DME verified.  CPT CODE: 78295

## 2013-11-02 DIAGNOSIS — Z96659 Presence of unspecified artificial knee joint: Secondary | ICD-10-CM | POA: Diagnosis not present

## 2013-11-02 DIAGNOSIS — N189 Chronic kidney disease, unspecified: Secondary | ICD-10-CM | POA: Diagnosis not present

## 2013-11-02 DIAGNOSIS — R112 Nausea with vomiting, unspecified: Secondary | ICD-10-CM | POA: Diagnosis not present

## 2013-11-02 DIAGNOSIS — Z471 Aftercare following joint replacement surgery: Secondary | ICD-10-CM | POA: Diagnosis not present

## 2013-11-02 DIAGNOSIS — I129 Hypertensive chronic kidney disease with stage 1 through stage 4 chronic kidney disease, or unspecified chronic kidney disease: Secondary | ICD-10-CM | POA: Diagnosis not present

## 2013-11-07 DIAGNOSIS — K5289 Other specified noninfective gastroenteritis and colitis: Secondary | ICD-10-CM | POA: Diagnosis not present

## 2013-11-07 DIAGNOSIS — J449 Chronic obstructive pulmonary disease, unspecified: Secondary | ICD-10-CM | POA: Diagnosis not present

## 2013-11-07 DIAGNOSIS — Z1331 Encounter for screening for depression: Secondary | ICD-10-CM | POA: Diagnosis not present

## 2013-11-16 DIAGNOSIS — J3489 Other specified disorders of nose and nasal sinuses: Secondary | ICD-10-CM | POA: Insufficient documentation

## 2013-11-16 NOTE — Progress Notes (Signed)
Patient ID: NATINA WIGINTON, female   DOB: [birth year], 79 y.o.   MRN: [id number]        HISTORY & PHYSICAL  DATE: 10/12/2013     FACILITY: [facility]  LEVEL OF CARE: SNF (31)  ALLERGIES:  Allergies  Allergen Reactions  . Pantoprazole     Pt feels it has been causing some diarrhea  . Spiriva [Tiotropium Bromide Monohydrate]     Pt feels that this causes constipation  . Tramadol Nausea Only    CHIEF COMPLAINT:  Manage right knee osteoarthritis, hypertension, and acute blood loss anemia.    HISTORY OF PRESENT ILLNESS:  The patient is an 79 year-old, Caucasian female.    KNEE OSTEOARTHRITIS: Patient had a history of pain and functional disability in the knee due to end-stage osteoarthritis and has failed nonsurgical conservative treatments. Patient had worsening of pain with activity and weight bearing, pain that interfered with activities of daily living & pain with passive range of motion. Therefore patient underwent total knee arthroplasty and tolerated the procedure well. Patient is admitted to this facility for sort short-term rehabilitation. Patient denies knee pain.    HTN: Pt 's HTN remains stable.  Denies CP, sob, DOE, pedal edema, headaches, dizziness or visual disturbances.  No complications from the medications currently being used.  Last BP :  128/67.    ANEMIA:  Postoperatively, patient suffered acute blood loss.  The anemia has been stable. The patient denies fatigue, melena or hematochezia. No complications from the medications currently being used.  Last hemoglobins are:    11.3, 10.4.    PAST MEDICAL HISTORY :  Past Medical History  Diagnosis Date  . Hypertension   . Shortness of breath     with exertion  . GERD (gastroesophageal reflux disease)   . 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 Arthritis     PAST SURGICAL HISTORY: Past Surgical History  Procedure Laterality Date  . Breast lumpectomy with axillary lymph  node dissection      right breast  . Total knee arthroplasty Right 10/05/2013    Procedure: TOTAL RIGHT  KNEE ARTHROPLASTY;  Surgeon: Jacki Cones, MD;  Location: [facility];  Service: Orthopedics;  Laterality: Right;    SOCIAL HISTORY:  reports that she has never smoked. She does not have any smokeless tobacco history on file. She reports that she drinks alcohol. She reports that she does not use illicit drugs.  FAMILY HISTORY: None  CURRENT MEDICATIONS: Reviewed per [facility]  REVIEW OF SYSTEMS:  See HPI otherwise 14 point ROS is negative.  PHYSICAL EXAMINATION  VS:  T 98.3       P 97      RR 20      BP 128/67      POX 97% room air        WT (Lb)  GENERAL: no acute distress, morbidly obese body habitus EYES: conjunctivae normal, sclerae normal, normal eye lids MOUTH/THROAT: lips without lesions,no lesions in the mouth,tongue is without lesions,uvula elevates in midline NECK: supple, trachea midline, no neck masses, no thyroid tenderness, no thyromegaly LYMPHATICS: no LAN in the neck, no supraclavicular LAN RESPIRATORY: breathing is even & unlabored, BS CTAB CARDIAC: RRR, no murmur,no extra heart sounds  EDEMA/VARICOSITIES: right lower extremity has +2 edema    GI:  ABDOMEN: abdomen soft, normal BS, no masses, no tenderness  LIVER/SPLEEN: no hepatomegaly, no splenomegaly MUSCULOSKELETAL: HEAD: normal to inspection & palpation BACK:  no kyphosis, scoliosis or spinal processes tenderness EXTREMITIES: LEFT UPPER EXTREMITY: full range of motion, normal strength & tone RIGHT UPPER EXTREMITY:  full range of motion, normal strength & tone LEFT LOWER EXTREMITY:  full range of motion, normal strength & tone RIGHT LOWER EXTREMITY: strength intact, range of motion not tested due to surgery   PSYCHIATRIC: the patient is alert & oriented to person, affect & behavior appropriate  LABS/RADIOLOGY: Right knee x-ray:  Showed periarticular osteophytes and joint space narrowing by imaging studies.     MRSA by PCR negative.     Staph aureus by PCR negative.    PTT 31.    Urinalysis negative.    Chest x-ray:  No acute disease.    Right knee x-ray postsurgically:   Showed right total knee arthroplasty.    Labs reviewed: Basic Metabolic Panel:  Recent Labs  16/10/96 1210 10/06/13 0343 10/07/13 0447  NA 140 135 135  K 4.8 4.3 4.1  CL 103 101 100  CO2 25 24 27   GLUCOSE 126* 157* 122*  BUN 22 19 32*  CREATININE 1.20* 1.05 1.33*  CALCIUM 10.7* 9.0 9.0   Liver Function Tests:  Recent Labs  09/29/13 1210  AST 23  ALT 19  ALKPHOS 84  BILITOT 0.4  PROT 7.7  ALBUMIN 4.1   CBC:  Recent Labs  10/06/13 0343 10/07/13 0447 10/07/13 1442 10/08/13 0443  WBC 20.3* 19.2*  --  14.2*  HGB 11.3* 10.4* 10.6* 9.9*  HCT 33.5* 31.8* 32.4* 30.7*  MCV 89.8 91.4  --  91.6  PLT 235 239  --  197    ASSESSMENT/PLAN:  Right knee osteoarthritis.  Status post right total knee arthroplasty.  Continue rehabilitation.    Acute blood loss anemia.  Continue iron.    Hypertension.  Well controlled.    Allergic rhinitis.  Well controlled.    Constipation.  Continue current laxatives.    Check CBC with diff and BMP.    I have reviewed patient's medical records received at admission/from hospitalization.  CPT CODE: 04540

## 2013-11-18 DIAGNOSIS — R197 Diarrhea, unspecified: Secondary | ICD-10-CM | POA: Diagnosis not present

## 2013-11-18 DIAGNOSIS — I129 Hypertensive chronic kidney disease with stage 1 through stage 4 chronic kidney disease, or unspecified chronic kidney disease: Secondary | ICD-10-CM | POA: Diagnosis not present

## 2013-11-18 DIAGNOSIS — K5289 Other specified noninfective gastroenteritis and colitis: Secondary | ICD-10-CM | POA: Diagnosis not present

## 2013-12-05 ENCOUNTER — Ambulatory Visit (HOSPITAL_COMMUNITY)
Admission: RE | Admit: 2013-12-05 | Discharge: 2013-12-05 | Disposition: A | Discharge status: Home / Self Care | Payer: Medicare Other | Source: Ambulatory Visit | Attending: Orthopedic Surgery | Admitting: Orthopedic Surgery

## 2013-12-05 ENCOUNTER — Other Ambulatory Visit (HOSPITAL_COMMUNITY): Payer: Self-pay | Admitting: Orthopedic Surgery

## 2013-12-05 DIAGNOSIS — M25561 Pain in right knee: Secondary | ICD-10-CM

## 2013-12-05 DIAGNOSIS — M7989 Other specified soft tissue disorders: Secondary | ICD-10-CM | POA: Diagnosis not present

## 2013-12-05 DIAGNOSIS — Z96659 Presence of unspecified artificial knee joint: Secondary | ICD-10-CM

## 2013-12-05 DIAGNOSIS — I82409 Acute embolism and thrombosis of unspecified deep veins of unspecified lower extremity: Secondary | ICD-10-CM | POA: Insufficient documentation

## 2013-12-05 NOTE — Progress Notes (Signed)
VASCULAR LAB PRELIMINARY  PRELIMINARY  PRELIMINARY  PRELIMINARY  Right lower extremity venous completed.    Preliminary report: Right leg is positive for deep vein thrombosis from  Mid calf up to mid thigh.  Results called to Dr. Darrelyn Hillock office and his office called Dr. Laverle Hobby office for medical management.  Instructions given to me to give to patient to pick up called in medication prescription and either pharmacy or home health would instruct use of Lovenox.   I called Dr. Laverle Hobby office to notify that Shelia Gardner did not feel comfortable giving injections to self.  Hilda Lias RN has put a call into patient. Shelia Gardner, RVT 12/05/2013, 4:44 PM

## 2013-12-07 DIAGNOSIS — Z7901 Long term (current) use of anticoagulants: Secondary | ICD-10-CM | POA: Diagnosis not present

## 2013-12-07 DIAGNOSIS — I82409 Acute embolism and thrombosis of unspecified deep veins of unspecified lower extremity: Secondary | ICD-10-CM | POA: Diagnosis not present

## 2013-12-09 DIAGNOSIS — Z7901 Long term (current) use of anticoagulants: Secondary | ICD-10-CM | POA: Diagnosis not present

## 2013-12-09 DIAGNOSIS — I82409 Acute embolism and thrombosis of unspecified deep veins of unspecified lower extremity: Secondary | ICD-10-CM | POA: Diagnosis not present

## 2013-12-19 DIAGNOSIS — Z7901 Long term (current) use of anticoagulants: Secondary | ICD-10-CM | POA: Diagnosis not present

## 2013-12-19 DIAGNOSIS — I82409 Acute embolism and thrombosis of unspecified deep veins of unspecified lower extremity: Secondary | ICD-10-CM | POA: Diagnosis not present

## 2013-12-20 DIAGNOSIS — I82409 Acute embolism and thrombosis of unspecified deep veins of unspecified lower extremity: Secondary | ICD-10-CM | POA: Diagnosis not present

## 2013-12-20 DIAGNOSIS — Z7901 Long term (current) use of anticoagulants: Secondary | ICD-10-CM | POA: Diagnosis not present

## 2013-12-21 DIAGNOSIS — N183 Chronic kidney disease, stage 3 unspecified: Secondary | ICD-10-CM | POA: Diagnosis not present

## 2013-12-21 DIAGNOSIS — I129 Hypertensive chronic kidney disease with stage 1 through stage 4 chronic kidney disease, or unspecified chronic kidney disease: Secondary | ICD-10-CM | POA: Diagnosis not present

## 2013-12-21 DIAGNOSIS — I82409 Acute embolism and thrombosis of unspecified deep veins of unspecified lower extremity: Secondary | ICD-10-CM | POA: Diagnosis not present

## 2013-12-22 DIAGNOSIS — Z7901 Long term (current) use of anticoagulants: Secondary | ICD-10-CM | POA: Diagnosis not present

## 2013-12-26 DIAGNOSIS — Z7901 Long term (current) use of anticoagulants: Secondary | ICD-10-CM | POA: Diagnosis not present

## 2013-12-26 DIAGNOSIS — I82409 Acute embolism and thrombosis of unspecified deep veins of unspecified lower extremity: Secondary | ICD-10-CM | POA: Diagnosis not present

## 2014-01-02 DIAGNOSIS — Z7901 Long term (current) use of anticoagulants: Secondary | ICD-10-CM | POA: Diagnosis not present

## 2014-01-02 DIAGNOSIS — I82409 Acute embolism and thrombosis of unspecified deep veins of unspecified lower extremity: Secondary | ICD-10-CM | POA: Diagnosis not present

## 2014-01-09 DIAGNOSIS — M25579 Pain in unspecified ankle and joints of unspecified foot: Secondary | ICD-10-CM | POA: Diagnosis not present

## 2014-01-09 DIAGNOSIS — Z96659 Presence of unspecified artificial knee joint: Secondary | ICD-10-CM | POA: Diagnosis not present

## 2014-01-10 DIAGNOSIS — I82409 Acute embolism and thrombosis of unspecified deep veins of unspecified lower extremity: Secondary | ICD-10-CM | POA: Diagnosis not present

## 2014-01-10 DIAGNOSIS — Z7901 Long term (current) use of anticoagulants: Secondary | ICD-10-CM | POA: Diagnosis not present

## 2014-01-12 DIAGNOSIS — I82409 Acute embolism and thrombosis of unspecified deep veins of unspecified lower extremity: Secondary | ICD-10-CM | POA: Diagnosis not present

## 2014-01-12 DIAGNOSIS — Z7901 Long term (current) use of anticoagulants: Secondary | ICD-10-CM | POA: Diagnosis not present

## 2014-01-16 DIAGNOSIS — Z7901 Long term (current) use of anticoagulants: Secondary | ICD-10-CM | POA: Diagnosis not present

## 2014-01-16 DIAGNOSIS — I82409 Acute embolism and thrombosis of unspecified deep veins of unspecified lower extremity: Secondary | ICD-10-CM | POA: Diagnosis not present

## 2014-01-19 DIAGNOSIS — Z7901 Long term (current) use of anticoagulants: Secondary | ICD-10-CM | POA: Diagnosis not present

## 2014-01-19 DIAGNOSIS — Z5181 Encounter for therapeutic drug level monitoring: Secondary | ICD-10-CM | POA: Diagnosis not present

## 2014-01-26 DIAGNOSIS — Z5181 Encounter for therapeutic drug level monitoring: Secondary | ICD-10-CM | POA: Diagnosis not present

## 2014-01-26 DIAGNOSIS — Z7901 Long term (current) use of anticoagulants: Secondary | ICD-10-CM | POA: Diagnosis not present

## 2014-01-30 DIAGNOSIS — I82409 Acute embolism and thrombosis of unspecified deep veins of unspecified lower extremity: Secondary | ICD-10-CM | POA: Diagnosis not present

## 2014-01-30 DIAGNOSIS — Z7901 Long term (current) use of anticoagulants: Secondary | ICD-10-CM | POA: Diagnosis not present

## 2014-02-06 DIAGNOSIS — Z5181 Encounter for therapeutic drug level monitoring: Secondary | ICD-10-CM | POA: Diagnosis not present

## 2014-02-06 DIAGNOSIS — Z7901 Long term (current) use of anticoagulants: Secondary | ICD-10-CM | POA: Diagnosis not present

## 2014-02-14 DIAGNOSIS — Z7901 Long term (current) use of anticoagulants: Secondary | ICD-10-CM | POA: Diagnosis not present

## 2014-02-14 DIAGNOSIS — I82409 Acute embolism and thrombosis of unspecified deep veins of unspecified lower extremity: Secondary | ICD-10-CM | POA: Diagnosis not present

## 2014-02-16 DIAGNOSIS — I129 Hypertensive chronic kidney disease with stage 1 through stage 4 chronic kidney disease, or unspecified chronic kidney disease: Secondary | ICD-10-CM | POA: Diagnosis not present

## 2014-02-16 DIAGNOSIS — N183 Chronic kidney disease, stage 3 unspecified: Secondary | ICD-10-CM | POA: Diagnosis not present

## 2014-02-28 DIAGNOSIS — M25579 Pain in unspecified ankle and joints of unspecified foot: Secondary | ICD-10-CM | POA: Diagnosis not present

## 2014-02-28 DIAGNOSIS — Z96659 Presence of unspecified artificial knee joint: Secondary | ICD-10-CM | POA: Diagnosis not present

## 2014-03-02 DIAGNOSIS — I82409 Acute embolism and thrombosis of unspecified deep veins of unspecified lower extremity: Secondary | ICD-10-CM | POA: Diagnosis not present

## 2014-03-02 DIAGNOSIS — Z7901 Long term (current) use of anticoagulants: Secondary | ICD-10-CM | POA: Diagnosis not present

## 2014-03-13 DIAGNOSIS — Z7901 Long term (current) use of anticoagulants: Secondary | ICD-10-CM | POA: Diagnosis not present

## 2014-03-13 DIAGNOSIS — I82409 Acute embolism and thrombosis of unspecified deep veins of unspecified lower extremity: Secondary | ICD-10-CM | POA: Diagnosis not present

## 2014-03-21 DIAGNOSIS — I872 Venous insufficiency (chronic) (peripheral): Secondary | ICD-10-CM | POA: Diagnosis not present

## 2014-03-21 DIAGNOSIS — Z6841 Body Mass Index (BMI) 40.0 and over, adult: Secondary | ICD-10-CM | POA: Diagnosis not present

## 2014-03-21 DIAGNOSIS — I129 Hypertensive chronic kidney disease with stage 1 through stage 4 chronic kidney disease, or unspecified chronic kidney disease: Secondary | ICD-10-CM | POA: Diagnosis not present

## 2014-03-21 DIAGNOSIS — N183 Chronic kidney disease, stage 3 unspecified: Secondary | ICD-10-CM | POA: Diagnosis not present

## 2014-03-21 DIAGNOSIS — I82409 Acute embolism and thrombosis of unspecified deep veins of unspecified lower extremity: Secondary | ICD-10-CM | POA: Diagnosis not present

## 2014-03-27 DIAGNOSIS — I82409 Acute embolism and thrombosis of unspecified deep veins of unspecified lower extremity: Secondary | ICD-10-CM | POA: Diagnosis not present

## 2014-03-27 DIAGNOSIS — Z7901 Long term (current) use of anticoagulants: Secondary | ICD-10-CM | POA: Diagnosis not present

## 2014-05-12 DIAGNOSIS — B0230 Zoster ocular disease, unspecified: Secondary | ICD-10-CM | POA: Diagnosis not present

## 2014-05-15 DIAGNOSIS — B0230 Zoster ocular disease, unspecified: Secondary | ICD-10-CM | POA: Diagnosis not present

## 2014-05-19 DIAGNOSIS — B0230 Zoster ocular disease, unspecified: Secondary | ICD-10-CM | POA: Diagnosis not present

## 2014-05-29 DIAGNOSIS — B0230 Zoster ocular disease, unspecified: Secondary | ICD-10-CM | POA: Diagnosis not present

## 2014-06-13 DIAGNOSIS — H5710 Ocular pain, unspecified eye: Secondary | ICD-10-CM | POA: Diagnosis not present

## 2014-07-18 DIAGNOSIS — H2510 Age-related nuclear cataract, unspecified eye: Secondary | ICD-10-CM | POA: Diagnosis not present

## 2014-07-18 DIAGNOSIS — H52209 Unspecified astigmatism, unspecified eye: Secondary | ICD-10-CM | POA: Diagnosis not present

## 2014-08-01 DIAGNOSIS — Z96659 Presence of unspecified artificial knee joint: Secondary | ICD-10-CM | POA: Diagnosis not present

## 2014-08-01 DIAGNOSIS — M7989 Other specified soft tissue disorders: Secondary | ICD-10-CM | POA: Diagnosis not present

## 2014-08-07 DIAGNOSIS — R609 Edema, unspecified: Secondary | ICD-10-CM | POA: Diagnosis not present

## 2014-08-07 DIAGNOSIS — N183 Chronic kidney disease, stage 3 unspecified: Secondary | ICD-10-CM | POA: Diagnosis not present

## 2014-08-07 DIAGNOSIS — I129 Hypertensive chronic kidney disease with stage 1 through stage 4 chronic kidney disease, or unspecified chronic kidney disease: Secondary | ICD-10-CM | POA: Diagnosis not present

## 2014-08-23 DIAGNOSIS — M533 Sacrococcygeal disorders, not elsewhere classified: Secondary | ICD-10-CM | POA: Diagnosis not present

## 2014-08-23 DIAGNOSIS — M1710 Unilateral primary osteoarthritis, unspecified knee: Secondary | ICD-10-CM | POA: Diagnosis not present

## 2014-08-23 DIAGNOSIS — Z96659 Presence of unspecified artificial knee joint: Secondary | ICD-10-CM | POA: Diagnosis not present

## 2014-08-24 DIAGNOSIS — Z23 Encounter for immunization: Secondary | ICD-10-CM | POA: Diagnosis not present

## 2014-09-06 DIAGNOSIS — M545 Low back pain: Secondary | ICD-10-CM | POA: Diagnosis not present

## 2014-09-06 DIAGNOSIS — M179 Osteoarthritis of knee, unspecified: Secondary | ICD-10-CM | POA: Diagnosis not present

## 2014-09-06 DIAGNOSIS — Z96651 Presence of right artificial knee joint: Secondary | ICD-10-CM | POA: Diagnosis not present

## 2014-09-06 DIAGNOSIS — Z9181 History of falling: Secondary | ICD-10-CM | POA: Diagnosis not present

## 2014-09-06 DIAGNOSIS — Z7982 Long term (current) use of aspirin: Secondary | ICD-10-CM | POA: Diagnosis not present

## 2014-09-06 DIAGNOSIS — Z96659 Presence of unspecified artificial knee joint: Secondary | ICD-10-CM | POA: Diagnosis not present

## 2014-09-08 DIAGNOSIS — M179 Osteoarthritis of knee, unspecified: Secondary | ICD-10-CM | POA: Diagnosis not present

## 2014-09-08 DIAGNOSIS — M545 Low back pain: Secondary | ICD-10-CM | POA: Diagnosis not present

## 2014-09-08 DIAGNOSIS — Z7982 Long term (current) use of aspirin: Secondary | ICD-10-CM | POA: Diagnosis not present

## 2014-09-08 DIAGNOSIS — Z96651 Presence of right artificial knee joint: Secondary | ICD-10-CM | POA: Diagnosis not present

## 2014-09-08 DIAGNOSIS — Z9181 History of falling: Secondary | ICD-10-CM | POA: Diagnosis not present

## 2014-09-12 DIAGNOSIS — Z7982 Long term (current) use of aspirin: Secondary | ICD-10-CM | POA: Diagnosis not present

## 2014-09-12 DIAGNOSIS — M179 Osteoarthritis of knee, unspecified: Secondary | ICD-10-CM | POA: Diagnosis not present

## 2014-09-12 DIAGNOSIS — Z9181 History of falling: Secondary | ICD-10-CM | POA: Diagnosis not present

## 2014-09-12 DIAGNOSIS — M545 Low back pain: Secondary | ICD-10-CM | POA: Diagnosis not present

## 2014-09-12 DIAGNOSIS — Z96651 Presence of right artificial knee joint: Secondary | ICD-10-CM | POA: Diagnosis not present

## 2014-09-14 DIAGNOSIS — Z7982 Long term (current) use of aspirin: Secondary | ICD-10-CM | POA: Diagnosis not present

## 2014-09-14 DIAGNOSIS — M545 Low back pain: Secondary | ICD-10-CM | POA: Diagnosis not present

## 2014-09-14 DIAGNOSIS — Z9181 History of falling: Secondary | ICD-10-CM | POA: Diagnosis not present

## 2014-09-14 DIAGNOSIS — M179 Osteoarthritis of knee, unspecified: Secondary | ICD-10-CM | POA: Diagnosis not present

## 2014-09-14 DIAGNOSIS — Z96651 Presence of right artificial knee joint: Secondary | ICD-10-CM | POA: Diagnosis not present

## 2014-09-15 ENCOUNTER — Other Ambulatory Visit: Payer: Self-pay

## 2014-09-15 DIAGNOSIS — Z1231 Encounter for screening mammogram for malignant neoplasm of breast: Secondary | ICD-10-CM

## 2014-09-18 DIAGNOSIS — Z9181 History of falling: Secondary | ICD-10-CM | POA: Diagnosis not present

## 2014-09-18 DIAGNOSIS — Z96651 Presence of right artificial knee joint: Secondary | ICD-10-CM | POA: Diagnosis not present

## 2014-09-18 DIAGNOSIS — M545 Low back pain: Secondary | ICD-10-CM | POA: Diagnosis not present

## 2014-09-18 DIAGNOSIS — M179 Osteoarthritis of knee, unspecified: Secondary | ICD-10-CM | POA: Diagnosis not present

## 2014-09-18 DIAGNOSIS — Z7982 Long term (current) use of aspirin: Secondary | ICD-10-CM | POA: Diagnosis not present

## 2014-09-20 DIAGNOSIS — Z7982 Long term (current) use of aspirin: Secondary | ICD-10-CM | POA: Diagnosis not present

## 2014-09-20 DIAGNOSIS — Z9181 History of falling: Secondary | ICD-10-CM | POA: Diagnosis not present

## 2014-09-20 DIAGNOSIS — M179 Osteoarthritis of knee, unspecified: Secondary | ICD-10-CM | POA: Diagnosis not present

## 2014-09-20 DIAGNOSIS — Z96651 Presence of right artificial knee joint: Secondary | ICD-10-CM | POA: Diagnosis not present

## 2014-09-20 DIAGNOSIS — M545 Low back pain: Secondary | ICD-10-CM | POA: Diagnosis not present

## 2014-09-27 DIAGNOSIS — N183 Chronic kidney disease, stage 3 (moderate): Secondary | ICD-10-CM | POA: Diagnosis not present

## 2014-09-27 DIAGNOSIS — Z6841 Body Mass Index (BMI) 40.0 and over, adult: Secondary | ICD-10-CM | POA: Diagnosis not present

## 2014-09-27 DIAGNOSIS — Z23 Encounter for immunization: Secondary | ICD-10-CM | POA: Diagnosis not present

## 2014-09-27 DIAGNOSIS — K5901 Slow transit constipation: Secondary | ICD-10-CM | POA: Diagnosis not present

## 2014-09-27 DIAGNOSIS — R600 Localized edema: Secondary | ICD-10-CM | POA: Diagnosis not present

## 2014-09-27 DIAGNOSIS — I129 Hypertensive chronic kidney disease with stage 1 through stage 4 chronic kidney disease, or unspecified chronic kidney disease: Secondary | ICD-10-CM | POA: Diagnosis not present

## 2014-09-27 DIAGNOSIS — H04032 Chronic enlargement of left lacrimal gland: Secondary | ICD-10-CM | POA: Diagnosis not present

## 2014-09-28 DIAGNOSIS — Z96651 Presence of right artificial knee joint: Secondary | ICD-10-CM | POA: Diagnosis not present

## 2014-09-28 DIAGNOSIS — M179 Osteoarthritis of knee, unspecified: Secondary | ICD-10-CM | POA: Diagnosis not present

## 2014-09-28 DIAGNOSIS — M545 Low back pain: Secondary | ICD-10-CM | POA: Diagnosis not present

## 2014-09-28 DIAGNOSIS — Z7982 Long term (current) use of aspirin: Secondary | ICD-10-CM | POA: Diagnosis not present

## 2014-09-28 DIAGNOSIS — Z9181 History of falling: Secondary | ICD-10-CM | POA: Diagnosis not present

## 2014-09-29 DIAGNOSIS — Z9181 History of falling: Secondary | ICD-10-CM | POA: Diagnosis not present

## 2014-09-29 DIAGNOSIS — Z96651 Presence of right artificial knee joint: Secondary | ICD-10-CM | POA: Diagnosis not present

## 2014-09-29 DIAGNOSIS — M179 Osteoarthritis of knee, unspecified: Secondary | ICD-10-CM | POA: Diagnosis not present

## 2014-09-29 DIAGNOSIS — M545 Low back pain: Secondary | ICD-10-CM | POA: Diagnosis not present

## 2014-09-29 DIAGNOSIS — Z7982 Long term (current) use of aspirin: Secondary | ICD-10-CM | POA: Diagnosis not present

## 2014-10-03 DIAGNOSIS — J984 Other disorders of lung: Secondary | ICD-10-CM | POA: Diagnosis not present

## 2014-10-03 DIAGNOSIS — Z0000 Encounter for general adult medical examination without abnormal findings: Secondary | ICD-10-CM | POA: Diagnosis not present

## 2014-10-03 DIAGNOSIS — N183 Chronic kidney disease, stage 3 (moderate): Secondary | ICD-10-CM | POA: Diagnosis not present

## 2014-10-03 DIAGNOSIS — C50911 Malignant neoplasm of unspecified site of right female breast: Secondary | ICD-10-CM | POA: Diagnosis not present

## 2014-10-03 DIAGNOSIS — Z1389 Encounter for screening for other disorder: Secondary | ICD-10-CM | POA: Diagnosis not present

## 2014-10-03 DIAGNOSIS — I129 Hypertensive chronic kidney disease with stage 1 through stage 4 chronic kidney disease, or unspecified chronic kidney disease: Secondary | ICD-10-CM | POA: Diagnosis not present

## 2014-10-03 DIAGNOSIS — J449 Chronic obstructive pulmonary disease, unspecified: Secondary | ICD-10-CM | POA: Diagnosis not present

## 2014-10-06 ENCOUNTER — Ambulatory Visit
Admission: RE | Admit: 2014-10-06 | Discharge: 2014-10-06 | Disposition: A | Discharge status: Home / Self Care | Payer: Medicare Other | Source: Ambulatory Visit

## 2014-10-06 DIAGNOSIS — Z1231 Encounter for screening mammogram for malignant neoplasm of breast: Secondary | ICD-10-CM | POA: Diagnosis not present

## 2014-12-25 DIAGNOSIS — D485 Neoplasm of uncertain behavior of skin: Secondary | ICD-10-CM | POA: Diagnosis not present

## 2015-01-01 DIAGNOSIS — B372 Candidiasis of skin and nail: Secondary | ICD-10-CM | POA: Diagnosis not present

## 2015-01-01 DIAGNOSIS — N183 Chronic kidney disease, stage 3 (moderate): Secondary | ICD-10-CM | POA: Diagnosis not present

## 2015-01-01 DIAGNOSIS — R609 Edema, unspecified: Secondary | ICD-10-CM | POA: Diagnosis not present

## 2015-01-01 DIAGNOSIS — R5383 Other fatigue: Secondary | ICD-10-CM | POA: Diagnosis not present

## 2015-01-01 DIAGNOSIS — I129 Hypertensive chronic kidney disease with stage 1 through stage 4 chronic kidney disease, or unspecified chronic kidney disease: Secondary | ICD-10-CM | POA: Diagnosis not present

## 2015-01-01 DIAGNOSIS — N390 Urinary tract infection, site not specified: Secondary | ICD-10-CM | POA: Diagnosis not present

## 2015-01-02 DIAGNOSIS — N390 Urinary tract infection, site not specified: Secondary | ICD-10-CM | POA: Diagnosis not present

## 2015-02-05 DIAGNOSIS — D367 Benign neoplasm of other specified sites: Secondary | ICD-10-CM | POA: Diagnosis not present

## 2015-02-05 DIAGNOSIS — H11433 Conjunctival hyperemia, bilateral: Secondary | ICD-10-CM | POA: Diagnosis not present

## 2015-02-05 DIAGNOSIS — D4989 Neoplasm of unspecified behavior of other specified sites: Secondary | ICD-10-CM | POA: Diagnosis not present

## 2015-02-28 ENCOUNTER — Other Ambulatory Visit: Payer: Self-pay | Admitting: Geriatric Medicine

## 2015-02-28 DIAGNOSIS — I129 Hypertensive chronic kidney disease with stage 1 through stage 4 chronic kidney disease, or unspecified chronic kidney disease: Secondary | ICD-10-CM | POA: Diagnosis not present

## 2015-02-28 DIAGNOSIS — Z6841 Body Mass Index (BMI) 40.0 and over, adult: Secondary | ICD-10-CM | POA: Diagnosis not present

## 2015-02-28 DIAGNOSIS — N183 Chronic kidney disease, stage 3 (moderate): Secondary | ICD-10-CM | POA: Diagnosis not present

## 2015-02-28 DIAGNOSIS — R109 Unspecified abdominal pain: Secondary | ICD-10-CM | POA: Diagnosis not present

## 2015-02-28 DIAGNOSIS — R112 Nausea with vomiting, unspecified: Secondary | ICD-10-CM | POA: Diagnosis not present

## 2015-02-28 DIAGNOSIS — B379 Candidiasis, unspecified: Secondary | ICD-10-CM | POA: Diagnosis not present

## 2015-02-28 DIAGNOSIS — J449 Chronic obstructive pulmonary disease, unspecified: Secondary | ICD-10-CM | POA: Diagnosis not present

## 2015-02-28 DIAGNOSIS — Z79899 Other long term (current) drug therapy: Secondary | ICD-10-CM | POA: Diagnosis not present

## 2015-02-28 DIAGNOSIS — R197 Diarrhea, unspecified: Secondary | ICD-10-CM | POA: Diagnosis not present

## 2015-02-28 DIAGNOSIS — R5383 Other fatigue: Secondary | ICD-10-CM | POA: Diagnosis not present

## 2015-03-05 ENCOUNTER — Ambulatory Visit
Admission: RE | Admit: 2015-03-05 | Discharge: 2015-03-05 | Disposition: A | Discharge status: Home / Self Care | Payer: Medicare Other | Source: Ambulatory Visit | Attending: Geriatric Medicine | Admitting: Geriatric Medicine

## 2015-03-05 DIAGNOSIS — K760 Fatty (change of) liver, not elsewhere classified: Secondary | ICD-10-CM | POA: Diagnosis not present

## 2015-03-05 DIAGNOSIS — K439 Ventral hernia without obstruction or gangrene: Secondary | ICD-10-CM | POA: Diagnosis not present

## 2015-03-05 DIAGNOSIS — K8020 Calculus of gallbladder without cholecystitis without obstruction: Secondary | ICD-10-CM | POA: Diagnosis not present

## 2015-03-05 DIAGNOSIS — R109 Unspecified abdominal pain: Secondary | ICD-10-CM

## 2015-03-05 DIAGNOSIS — K5730 Diverticulosis of large intestine without perforation or abscess without bleeding: Secondary | ICD-10-CM | POA: Diagnosis not present

## 2015-03-05 MED ORDER — IOPAMIDOL (ISOVUE-300) INJECTION 61%
125.0000 mL | Freq: Once | INTRAVENOUS | Status: AC | PRN
  Administered 2015-03-05: 125 mL via INTRAVENOUS

## 2015-03-12 ENCOUNTER — Other Ambulatory Visit: Payer: Self-pay | Admitting: Ophthalmology

## 2015-03-12 DIAGNOSIS — H0530 Unspecified deformity of orbit: Secondary | ICD-10-CM | POA: Diagnosis not present

## 2015-03-12 DIAGNOSIS — H0489 Other disorders of lacrimal system: Secondary | ICD-10-CM | POA: Diagnosis not present

## 2015-03-12 DIAGNOSIS — H0419 Other specified disorders of lacrimal gland: Secondary | ICD-10-CM | POA: Diagnosis not present

## 2015-03-12 DIAGNOSIS — D3152 Benign neoplasm of left lacrimal gland and duct: Secondary | ICD-10-CM | POA: Diagnosis not present

## 2015-03-12 DIAGNOSIS — H04032 Chronic enlargement of left lacrimal gland: Secondary | ICD-10-CM | POA: Diagnosis not present

## 2015-03-12 DIAGNOSIS — H05222 Edema of left orbit: Secondary | ICD-10-CM | POA: Diagnosis not present

## 2015-03-12 DIAGNOSIS — H0510 Unspecified chronic inflammatory disorders of orbit: Secondary | ICD-10-CM | POA: Diagnosis not present

## 2015-03-28 ENCOUNTER — Other Ambulatory Visit: Payer: Self-pay | Admitting: Gastroenterology

## 2015-03-28 DIAGNOSIS — K439 Ventral hernia without obstruction or gangrene: Secondary | ICD-10-CM | POA: Diagnosis not present

## 2015-03-28 DIAGNOSIS — M4806 Spinal stenosis, lumbar region: Secondary | ICD-10-CM | POA: Diagnosis not present

## 2015-03-28 DIAGNOSIS — K760 Fatty (change of) liver, not elsewhere classified: Secondary | ICD-10-CM | POA: Diagnosis not present

## 2015-03-28 DIAGNOSIS — R1110 Vomiting, unspecified: Secondary | ICD-10-CM

## 2015-03-28 DIAGNOSIS — K8020 Calculus of gallbladder without cholecystitis without obstruction: Secondary | ICD-10-CM | POA: Diagnosis not present

## 2015-03-30 IMAGING — CR DG KNEE 1-2V PORT*R*
1 series · 2 of 2 positions shown · non-contrast
Comparison: 06/02/2011.

CLINICAL DATA: Postop right total knee arthroplasty.

EXAM:
PORTABLE RIGHT KNEE - 1-2 VIEW

[Series 1: AP · right · 2 of 2 slices shown]
[im 1/2]
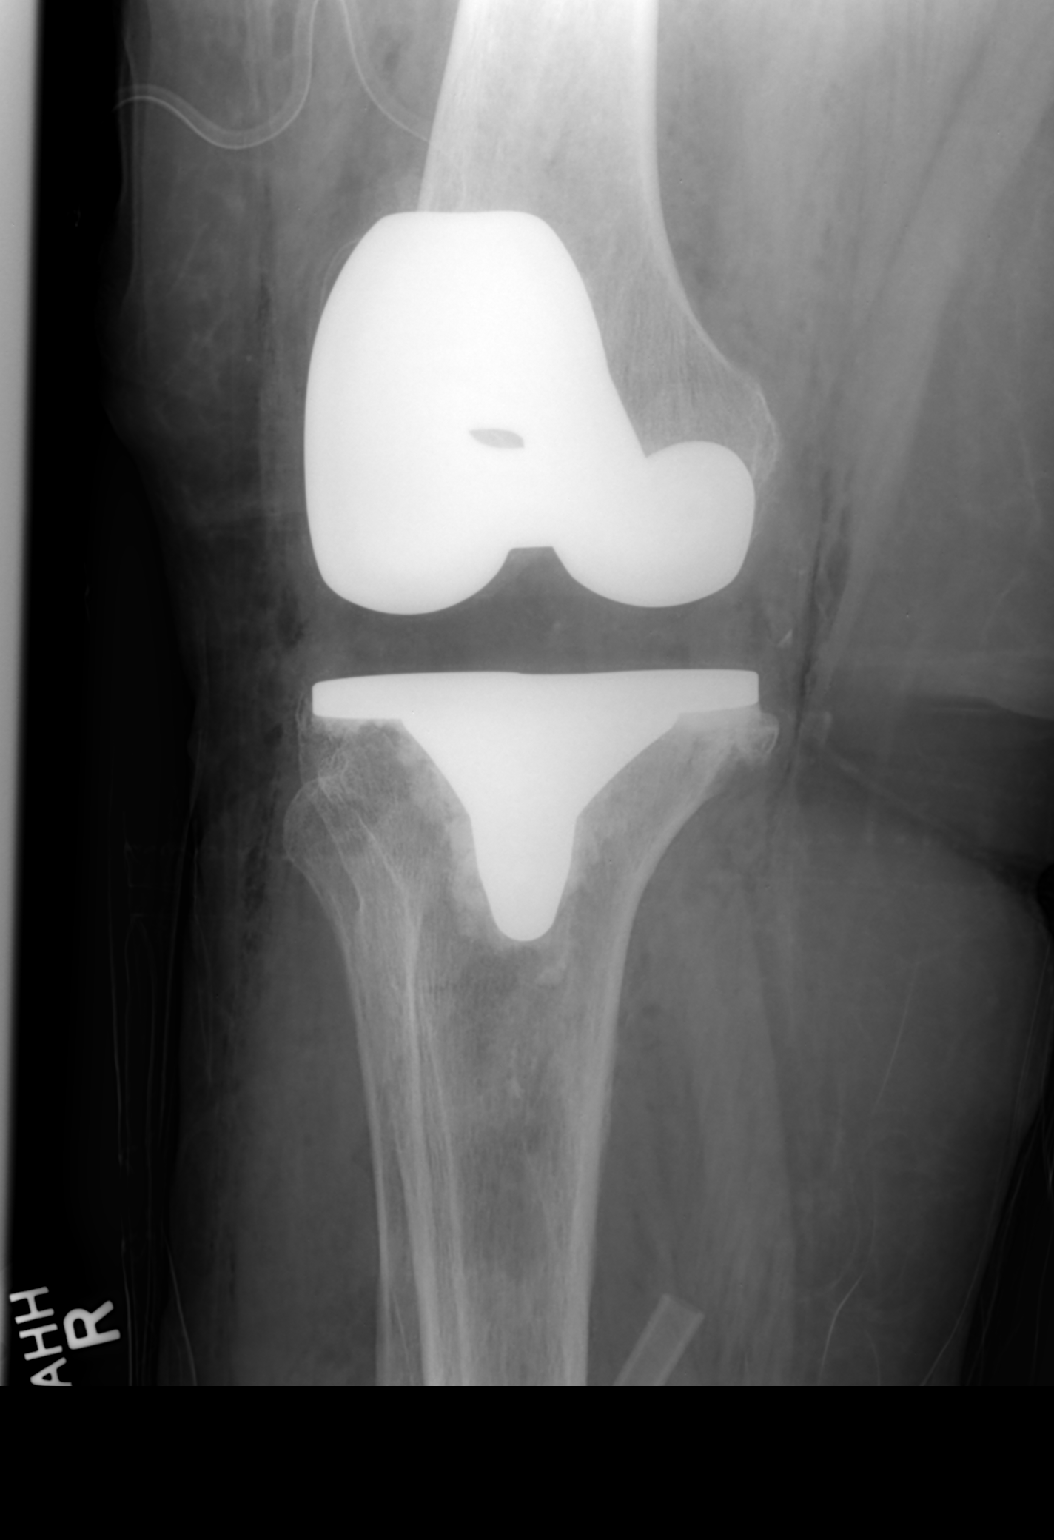
[im 2/2]
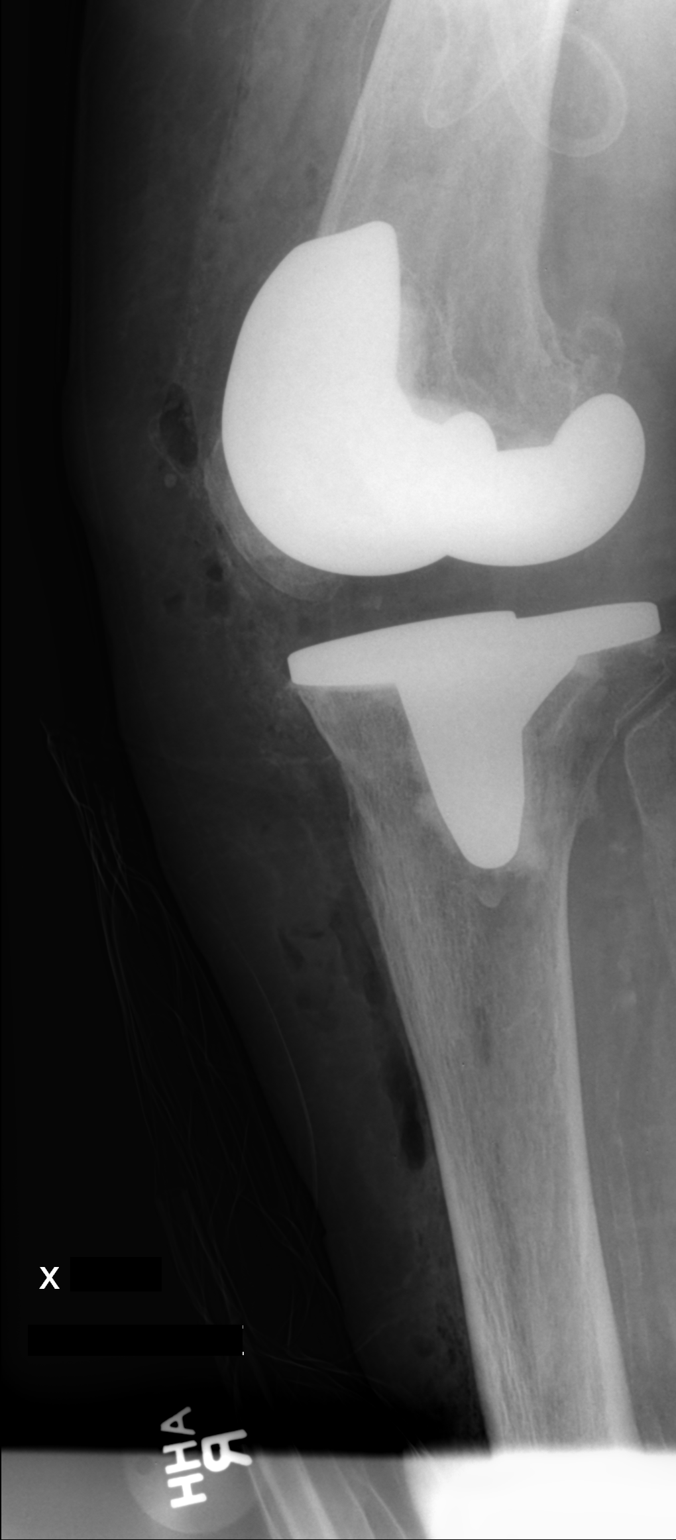

[2 of 2 positions shown; findings below may reference images not displayed]

FINDINGS: The right knee demonstrates a total knee arthroplasty without
evidence of hardware failure complication. There is no significant
joint effusion. There is no fracture or dislocation. The alignment
is anatomic. Surgical drains are present. Post-surgical changes
noted in the surrounding soft tissues.
IMPRESSION: Right total knee arthroplasty.

## 2015-04-02 ENCOUNTER — Ambulatory Visit
Admission: RE | Admit: 2015-04-02 | Discharge: 2015-04-02 | Disposition: A | Discharge status: Home / Self Care | Payer: Medicare Other | Source: Ambulatory Visit | Attending: Gastroenterology | Admitting: Gastroenterology

## 2015-04-02 ENCOUNTER — Other Ambulatory Visit: Payer: Self-pay | Admitting: Gastroenterology

## 2015-04-02 DIAGNOSIS — R1110 Vomiting, unspecified: Secondary | ICD-10-CM

## 2015-04-02 DIAGNOSIS — K228 Other specified diseases of esophagus: Secondary | ICD-10-CM | POA: Diagnosis not present

## 2015-04-09 DIAGNOSIS — Z96651 Presence of right artificial knee joint: Secondary | ICD-10-CM | POA: Diagnosis not present

## 2015-04-09 DIAGNOSIS — Z471 Aftercare following joint replacement surgery: Secondary | ICD-10-CM | POA: Diagnosis not present

## 2015-04-09 DIAGNOSIS — M7989 Other specified soft tissue disorders: Secondary | ICD-10-CM | POA: Diagnosis not present

## 2015-04-24 DIAGNOSIS — R600 Localized edema: Secondary | ICD-10-CM | POA: Diagnosis not present

## 2015-04-24 DIAGNOSIS — R030 Elevated blood-pressure reading, without diagnosis of hypertension: Secondary | ICD-10-CM | POA: Diagnosis not present

## 2015-04-24 DIAGNOSIS — Z1389 Encounter for screening for other disorder: Secondary | ICD-10-CM | POA: Diagnosis not present

## 2015-04-24 DIAGNOSIS — F320 Major depressive disorder, single episode, mild: Secondary | ICD-10-CM | POA: Diagnosis not present

## 2015-05-25 DIAGNOSIS — J449 Chronic obstructive pulmonary disease, unspecified: Secondary | ICD-10-CM | POA: Diagnosis not present

## 2015-05-25 DIAGNOSIS — I872 Venous insufficiency (chronic) (peripheral): Secondary | ICD-10-CM | POA: Diagnosis not present

## 2015-07-24 DIAGNOSIS — H2513 Age-related nuclear cataract, bilateral: Secondary | ICD-10-CM | POA: Diagnosis not present

## 2015-07-24 DIAGNOSIS — H52203 Unspecified astigmatism, bilateral: Secondary | ICD-10-CM | POA: Diagnosis not present

## 2015-09-27 DIAGNOSIS — Z23 Encounter for immunization: Secondary | ICD-10-CM | POA: Diagnosis not present

## 2015-10-01 DIAGNOSIS — B372 Candidiasis of skin and nail: Secondary | ICD-10-CM | POA: Diagnosis not present

## 2015-10-01 DIAGNOSIS — I872 Venous insufficiency (chronic) (peripheral): Secondary | ICD-10-CM | POA: Diagnosis not present

## 2015-10-01 DIAGNOSIS — I129 Hypertensive chronic kidney disease with stage 1 through stage 4 chronic kidney disease, or unspecified chronic kidney disease: Secondary | ICD-10-CM | POA: Diagnosis not present

## 2015-10-01 DIAGNOSIS — Z6841 Body Mass Index (BMI) 40.0 and over, adult: Secondary | ICD-10-CM | POA: Diagnosis not present

## 2015-10-01 DIAGNOSIS — N183 Chronic kidney disease, stage 3 (moderate): Secondary | ICD-10-CM | POA: Diagnosis not present

## 2015-10-01 DIAGNOSIS — L89109 Pressure ulcer of unspecified part of back, unspecified stage: Secondary | ICD-10-CM | POA: Diagnosis not present

## 2015-10-01 DIAGNOSIS — J449 Chronic obstructive pulmonary disease, unspecified: Secondary | ICD-10-CM | POA: Diagnosis not present

## 2015-11-20 DIAGNOSIS — N183 Chronic kidney disease, stage 3 (moderate): Secondary | ICD-10-CM | POA: Diagnosis not present

## 2015-11-20 DIAGNOSIS — B372 Candidiasis of skin and nail: Secondary | ICD-10-CM | POA: Diagnosis not present

## 2015-11-20 DIAGNOSIS — I129 Hypertensive chronic kidney disease with stage 1 through stage 4 chronic kidney disease, or unspecified chronic kidney disease: Secondary | ICD-10-CM | POA: Diagnosis not present

## 2015-12-12 DIAGNOSIS — R05 Cough: Secondary | ICD-10-CM | POA: Diagnosis not present

## 2015-12-12 DIAGNOSIS — J309 Allergic rhinitis, unspecified: Secondary | ICD-10-CM | POA: Diagnosis not present

## 2015-12-13 DIAGNOSIS — R600 Localized edema: Secondary | ICD-10-CM | POA: Diagnosis not present

## 2015-12-13 DIAGNOSIS — N183 Chronic kidney disease, stage 3 (moderate): Secondary | ICD-10-CM | POA: Diagnosis not present

## 2015-12-13 DIAGNOSIS — L304 Erythema intertrigo: Secondary | ICD-10-CM | POA: Diagnosis not present

## 2015-12-13 DIAGNOSIS — L89312 Pressure ulcer of right buttock, stage 2: Secondary | ICD-10-CM | POA: Diagnosis not present

## 2016-02-13 DIAGNOSIS — L8990 Pressure ulcer of unspecified site, unspecified stage: Secondary | ICD-10-CM | POA: Diagnosis not present

## 2016-02-13 DIAGNOSIS — Z6841 Body Mass Index (BMI) 40.0 and over, adult: Secondary | ICD-10-CM | POA: Diagnosis not present

## 2016-02-13 DIAGNOSIS — B372 Candidiasis of skin and nail: Secondary | ICD-10-CM | POA: Diagnosis not present

## 2016-02-13 DIAGNOSIS — J449 Chronic obstructive pulmonary disease, unspecified: Secondary | ICD-10-CM | POA: Diagnosis not present

## 2016-02-13 DIAGNOSIS — I10 Essential (primary) hypertension: Secondary | ICD-10-CM | POA: Diagnosis not present

## 2016-05-07 ENCOUNTER — Ambulatory Visit
Admission: RE | Admit: 2016-05-07 | Discharge: 2016-05-07 | Disposition: A | Discharge status: Home / Self Care | Payer: Medicare Other | Source: Ambulatory Visit | Attending: Geriatric Medicine | Admitting: Geriatric Medicine

## 2016-05-07 ENCOUNTER — Other Ambulatory Visit: Payer: Self-pay | Admitting: Geriatric Medicine

## 2016-05-07 DIAGNOSIS — R0602 Shortness of breath: Secondary | ICD-10-CM | POA: Diagnosis not present

## 2016-05-07 DIAGNOSIS — R0609 Other forms of dyspnea: Principal | ICD-10-CM

## 2016-05-07 DIAGNOSIS — I129 Hypertensive chronic kidney disease with stage 1 through stage 4 chronic kidney disease, or unspecified chronic kidney disease: Secondary | ICD-10-CM | POA: Diagnosis not present

## 2016-05-07 DIAGNOSIS — B372 Candidiasis of skin and nail: Secondary | ICD-10-CM | POA: Diagnosis not present

## 2016-05-07 DIAGNOSIS — J984 Other disorders of lung: Secondary | ICD-10-CM | POA: Diagnosis not present

## 2016-05-07 DIAGNOSIS — L89310 Pressure ulcer of right buttock, unstageable: Secondary | ICD-10-CM | POA: Diagnosis not present

## 2016-05-07 DIAGNOSIS — H1131 Conjunctival hemorrhage, right eye: Secondary | ICD-10-CM | POA: Diagnosis not present

## 2016-05-07 DIAGNOSIS — N183 Chronic kidney disease, stage 3 (moderate): Secondary | ICD-10-CM | POA: Diagnosis not present

## 2016-05-07 DIAGNOSIS — R05 Cough: Secondary | ICD-10-CM | POA: Diagnosis not present

## 2016-05-08 ENCOUNTER — Other Ambulatory Visit: Payer: Self-pay | Admitting: Geriatric Medicine

## 2016-05-08 DIAGNOSIS — R9389 Abnormal findings on diagnostic imaging of other specified body structures: Secondary | ICD-10-CM

## 2016-05-14 ENCOUNTER — Ambulatory Visit
Admission: RE | Admit: 2016-05-14 | Discharge: 2016-05-14 | Disposition: A | Discharge status: Home / Self Care | Payer: Medicare Other | Source: Ambulatory Visit | Attending: Geriatric Medicine | Admitting: Geriatric Medicine

## 2016-05-14 ENCOUNTER — Inpatient Hospital Stay: Admission: RE | Admit: 2016-05-14 | Payer: Medicare Other | Source: Ambulatory Visit

## 2016-05-14 DIAGNOSIS — R9389 Abnormal findings on diagnostic imaging of other specified body structures: Secondary | ICD-10-CM

## 2016-05-14 DIAGNOSIS — J849 Interstitial pulmonary disease, unspecified: Secondary | ICD-10-CM | POA: Diagnosis not present

## 2016-05-28 DIAGNOSIS — I700 Atherosclerosis of aorta: Secondary | ICD-10-CM | POA: Diagnosis not present

## 2016-05-28 DIAGNOSIS — B372 Candidiasis of skin and nail: Secondary | ICD-10-CM | POA: Diagnosis not present

## 2016-05-28 DIAGNOSIS — Z1389 Encounter for screening for other disorder: Secondary | ICD-10-CM | POA: Diagnosis not present

## 2016-05-28 DIAGNOSIS — J449 Chronic obstructive pulmonary disease, unspecified: Secondary | ICD-10-CM | POA: Diagnosis not present

## 2016-05-28 DIAGNOSIS — M4806 Spinal stenosis, lumbar region: Secondary | ICD-10-CM | POA: Diagnosis not present

## 2016-05-28 DIAGNOSIS — N183 Chronic kidney disease, stage 3 (moderate): Secondary | ICD-10-CM | POA: Diagnosis not present

## 2016-05-28 DIAGNOSIS — J8410 Pulmonary fibrosis, unspecified: Secondary | ICD-10-CM | POA: Diagnosis not present

## 2016-05-28 DIAGNOSIS — Z6841 Body Mass Index (BMI) 40.0 and over, adult: Secondary | ICD-10-CM | POA: Diagnosis not present

## 2016-05-28 DIAGNOSIS — I129 Hypertensive chronic kidney disease with stage 1 through stage 4 chronic kidney disease, or unspecified chronic kidney disease: Secondary | ICD-10-CM | POA: Diagnosis not present

## 2016-05-28 DIAGNOSIS — Z0000 Encounter for general adult medical examination without abnormal findings: Secondary | ICD-10-CM | POA: Diagnosis not present

## 2016-06-16 DIAGNOSIS — I129 Hypertensive chronic kidney disease with stage 1 through stage 4 chronic kidney disease, or unspecified chronic kidney disease: Secondary | ICD-10-CM | POA: Diagnosis not present

## 2016-06-16 DIAGNOSIS — R600 Localized edema: Secondary | ICD-10-CM | POA: Diagnosis not present

## 2016-06-16 DIAGNOSIS — N183 Chronic kidney disease, stage 3 (moderate): Secondary | ICD-10-CM | POA: Diagnosis not present

## 2016-06-18 ENCOUNTER — Encounter: Payer: Self-pay | Admitting: Pulmonary Disease

## 2016-06-18 ENCOUNTER — Other Ambulatory Visit (INDEPENDENT_AMBULATORY_CARE_PROVIDER_SITE_OTHER): Payer: Medicare Other

## 2016-06-18 ENCOUNTER — Ambulatory Visit (INDEPENDENT_AMBULATORY_CARE_PROVIDER_SITE_OTHER): Payer: Medicare Other | Admitting: Pulmonary Disease

## 2016-06-18 ENCOUNTER — Encounter (INDEPENDENT_AMBULATORY_CARE_PROVIDER_SITE_OTHER): Payer: Self-pay

## 2016-06-18 VITALS — BP 122/60 | HR 90 | Ht 62.0 in | Wt 236.0 lb

## 2016-06-18 DIAGNOSIS — J449 Chronic obstructive pulmonary disease, unspecified: Secondary | ICD-10-CM | POA: Diagnosis not present

## 2016-06-18 DIAGNOSIS — R0600 Dyspnea, unspecified: Secondary | ICD-10-CM | POA: Diagnosis not present

## 2016-06-18 DIAGNOSIS — J439 Emphysema, unspecified: Secondary | ICD-10-CM

## 2016-06-18 DIAGNOSIS — J849 Interstitial pulmonary disease, unspecified: Secondary | ICD-10-CM

## 2016-06-18 DIAGNOSIS — E669 Obesity, unspecified: Secondary | ICD-10-CM

## 2016-06-18 DIAGNOSIS — R0609 Other forms of dyspnea: Secondary | ICD-10-CM

## 2016-06-18 DIAGNOSIS — G4710 Hypersomnia, unspecified: Secondary | ICD-10-CM

## 2016-06-18 LAB — CBC WITH DIFFERENTIAL/PLATELET
BASOS ABS: 0 10*3/uL (ref 0.0–0.1)
BASOS PCT: 0.4 % (ref 0.0–3.0)
EOS ABS: 0.8 10*3/uL — AB (ref 0.0–0.7)
Eosinophils Relative: 6 % — ABNORMAL HIGH (ref 0.0–5.0)
HEMATOCRIT: 44.5 % (ref 36.0–46.0)
HEMOGLOBIN: 14.8 g/dL (ref 12.0–15.0)
LYMPHS PCT: 12.8 % (ref 12.0–46.0)
Lymphs Abs: 1.7 10*3/uL (ref 0.7–4.0)
MCHC: 33.3 g/dL (ref 30.0–36.0)
MCV: 88.9 fl (ref 78.0–100.0)
MONOS PCT: 5.9 % (ref 3.0–12.0)
Monocytes Absolute: 0.8 10*3/uL (ref 0.1–1.0)
NEUTROS ABS: 9.8 10*3/uL — AB (ref 1.4–7.7)
Neutrophils Relative %: 74.9 % (ref 43.0–77.0)
PLATELETS: 250 10*3/uL (ref 150.0–400.0)
RBC: 5.01 Mil/uL (ref 3.87–5.11)
RDW: 14.5 % (ref 11.5–15.5)
WBC: 13.1 10*3/uL — AB (ref 4.0–10.5)

## 2016-06-18 LAB — COMPREHENSIVE METABOLIC PANEL
ALBUMIN: 3.8 g/dL (ref 3.5–5.2)
ALK PHOS: 73 U/L (ref 39–117)
ALT: 10 U/L (ref 0–35)
AST: 16 U/L (ref 0–37)
BILIRUBIN TOTAL: 0.4 mg/dL (ref 0.2–1.2)
BUN: 28 mg/dL — ABNORMAL HIGH (ref 6–23)
CALCIUM: 9.5 mg/dL (ref 8.4–10.5)
CO2: 25 mEq/L (ref 19–32)
CREATININE: 1.21 mg/dL — AB (ref 0.40–1.20)
Chloride: 103 mEq/L (ref 96–112)
GFR: 45.1 mL/min — AB (ref >60.00)
Glucose, Bld: 114 mg/dL — ABNORMAL HIGH (ref 70–99)
Potassium: 4.1 mEq/L (ref 3.5–5.1)
Sodium: 137 mEq/L (ref 135–145)
TOTAL PROTEIN: 7.7 g/dL (ref 6.0–8.3)

## 2016-06-18 LAB — RHEUMATOID FACTOR: Rhuematoid fact SerPl-aCnc: 42 IU/mL — ABNORMAL HIGH (ref <=14)

## 2016-06-18 LAB — BRAIN NATRIURETIC PEPTIDE: PRO B NATRI PEPTIDE: 38 pg/mL (ref 0.0–100.0)

## 2016-06-18 MED ORDER — PREDNISONE 20 MG PO TABS: 20.0000 mg | ORAL_TABLET | Freq: Every day | ORAL | Status: DC

## 2016-06-18 MED ORDER — IPRATROPIUM-ALBUTEROL 0.5-2.5 (3) MG/3ML IN SOLN: 3.0000 mL | Freq: Four times a day (QID) | RESPIRATORY_TRACT | Status: AC

## 2016-06-18 MED ORDER — BUDESONIDE 0.5 MG/2ML IN SUSP: 0.5000 mg | Freq: Two times a day (BID) | RESPIRATORY_TRACT | Status: DC

## 2016-06-18 NOTE — Patient Instructions (Signed)
It was a pleasure taking care of you today!  You are diagnosed with Interstitial Lung Disease (ILD).  This usually causes swelling and/or scarring of your lungs causing you to be short of breath, have cough, or have low oxygen level.   Please make sure you take your medications on a regular basis.  Your medications include the following: Pulmicort neb 0.5 mg BID and Duoneb 4x/day. Try prednisone 20 mg/day for 5 days.   We will do blood test, heart test, breathing test, and oxygen test.   Please call the office if your symptoms worsen. You may have a flare up of ILD and this may require steroids and antibiotics.   Please make sure your vaccines are up to date.   Return to clinic in 6-8 weeks with Dr. Corrie Dandy or NP

## 2016-06-18 NOTE — Progress Notes (Signed)
Subjective:    Patient ID: Shelia Gardner, female    DOB: June 26, 1930, 82 y.o.   MRN: [id number]  HPI   This is the case of Shelia Gardner, 82 y.o. Female, who was referred by Dr. Lajean Manes  in consultation regarding abnormal chest CT scan.   As you very well know, patient is a non smoker but exposed to 2nd hand smoke with father and husband.  Questionable  History of COPD.   She has been gradually SOB the last 3 yrs, worse the last 1/2 year.  She was busy with her husband who recently passed away so she was not really following up on doctors for her SOB.  She was started on combivent 2P QID the last month and it helped some.  Still remains SOB for the most part.  This weekend, she was so SOB with wheezing, the daughter thought pt would need to go to ER.  She eventually improved with meds and rest.   Pt also has on and off yeast inection in both her thighs.  She has taken PO antifungal as well as antifungal cream.   Pt has lived at her current house for 8-9 yrs.  She lives by herself now.  Daughter lives next door.  She had pet birds but they got rid of them in the fall.  She has a cat which she has had x 4 yrs. (-) dogs. Denies molds or stagnant water. (-) Carpets.  Denies anything in the environment she might be allergic to.  Denies PTB exposure.   Daughter watches pt when she sleeps. Some snoring and gasping.  Pt has some hypersomnia in am but not too bad.  Denies recent hospitalization. No recent Abx.   Review of Systems  Constitutional: Negative.  Negative for fever and unexpected weight change.  HENT: Positive for postnasal drip and sneezing. Negative for congestion, dental problem, ear pain, nosebleeds, rhinorrhea, sinus pressure, sore throat and trouble swallowing.   Eyes: Negative.  Negative for redness and itching.  Respiratory: Positive for cough, shortness of breath and wheezing. Negative for chest tightness.   Cardiovascular: Positive for leg swelling. Negative  for palpitations.  Gastrointestinal: Negative for nausea and vomiting.  Genitourinary: Negative for dysuria.  Musculoskeletal: Negative.  Negative for joint swelling.  Skin: Negative.  Negative for rash.  Allergic/Immunologic: Negative.   Neurological: Negative.  Negative for headaches.  Hematological: Negative.  Does not bruise/bleed easily.  Psychiatric/Behavioral: Negative for dysphoric mood. The patient is not nervous/anxious.   All other systems reviewed and are negative.  Past Medical History  Diagnosis Date  . Hypertension   . Shortness of breath     with exertion  . GERD (gastroesophageal reflux disease)   . 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 Arthritis     DVT R leg after suergery 2015. On temporary [place]   Breast CA, R, 2002. CA free/    Possible CKD. Mildly elevated creat.   No family history on file.  Both parents are deceased.  Father had CAD.  Mother had Breast CA.   Past Surgical History  Procedure Laterality Date  . Breast lumpectomy with axillary lymph node dissection      right breast  . Total knee arthroplasty Right 10/05/2013    Procedure: TOTAL RIGHT  KNEE ARTHROPLASTY;  Surgeon: Tobi Bastos, MD;  Location: [facility];  Service: Orthopedics;  Laterality: Right;    Social History   Social History  .  Marital Status: Married    Spouse Name: N/A  . Number of Children: N/A  . Years of Education: N/A   Occupational History  . Not on file.   Social History Main Topics  . Smoking status: Never Smoker   . Smokeless tobacco: Not on file  . Alcohol Use: Yes     Comment: rarely wine  . Drug Use: No  . Sexual Activity: Not on file   Other Topics Concern  . Not on file   Social History Narrative   Widow recently. (-) smoking. Lives by herself, near daughter.   Allergies  Allergen Reactions  . Pantoprazole     Pt feels it has been causing some diarrhea  . Spiriva [Tiotropium Bromide Monohydrate]     Pt feels that this  causes constipation  . Tramadol Nausea Only     Outpatient Prescriptions Prior to Visit  Medication Sig Dispense Refill  . fexofenadine (ALLEGRA) 180 MG tablet Take 180 mg by mouth daily.    Marland Kitchen lisinopril (PRINIVIL,ZESTRIL) 10 MG tablet Take 5 mg by mouth every morning. TAKES 1/2 TABLET    . acetaminophen (TYLENOL) 500 MG tablet Take 1,000 mg by mouth every 6 (six) hours as needed for pain.    Marland Kitchen albuterol (PROVENTIL HFA;VENTOLIN HFA) 108 (90 BASE) MCG/ACT inhaler Inhale 2 puffs into the lungs every 6 (six) hours as needed for wheezing.    Marland Kitchen amLODipine (NORVASC) 2.5 MG tablet Take 2.5 mg by mouth every morning.    . ferrous sulfate 325 (65 FE) MG tablet Take 1 tablet (325 mg total) by mouth 3 (three) times daily after meals. 45 tablet 0  . hydrochlorothiazide (HYDRODIURIL) 25 MG tablet Take 25 mg by mouth every morning.    Marland Kitchen ketoconazole (NIZORAL) 2 % cream Apply 1 application topically daily.    . methocarbamol (ROBAXIN) 500 MG tablet Take 1 tablet (500 mg total) by mouth every 6 (six) hours as needed. 40 tablet 1  . oxyCODONE (ROXICODONE) 5 MG immediate release tablet Take one tablet by mouth every 4 hours as needed for pain; Take 2 tablets by mouth every 4 hours as needed for moderate pain; Take 3 tablet by mouth every 4 hours as needed for severe pain 360 tablet 0  . polyethylene glycol (MIRALAX / GLYCOLAX) packet Take 17 g by mouth daily as needed. 14 each 0  . rivaroxaban (XARELTO) 10 MG TABS tablet Take 1 tablet (10 mg total) by mouth daily with breakfast. 19 tablet 0   No facility-administered medications prior to visit.   Meds ordered this encounter  Medications  . aspirin 81 MG tablet    Sig: Take 81 mg by mouth daily.  . Multiple Vitamin (MULTIVITAMIN) tablet    Sig: Take 1 tablet by mouth daily.  . Multiple Vitamins-Minerals (OCUVITE PO)    Sig: Take 1 tablet by mouth daily.  . Ipratropium-Albuterol (COMBIVENT RESPIMAT) 20-100 MCG/ACT AERS respimat    Sig: Inhale 1 puff into  the lungs every 6 (six) hours.  . predniSONE (DELTASONE) 20 MG tablet    Sig: Take 1 tablet (20 mg total) by mouth daily with breakfast.    Dispense:  14 tablet    Refill:  0  . budesonide (PULMICORT) 0.5 MG/2ML nebulizer solution    Sig: Take 2 mLs (0.5 mg total) by nebulization 2 (two) times daily.    Dispense:  120 mL    Refill:  11  . ipratropium-albuterol (DUONEB) 0.5-2.5 (3) MG/3ML SOLN    Sig: Take  3 mLs by nebulization 4 (four) times daily.    Dispense:  360 mL    Refill:  11         Objective:   Physical Exam  Vitals:  Filed Vitals:   06/18/16 0943  BP: 122/60  Pulse: 90  Height: 5\' 2"  (1.575 m)  Weight: 236 lb (107.049 kg)  SpO2: 92%    Constitutional/General:  Pleasant, well-nourished, well-developed, not in any distress,  Comfortably seating.  Well kempt Using walker to walk.  Morbidly obese  Body mass index is 43.15 kg/(m^2). Wt Readings from Last 3 Encounters:  06/18/16 236 lb (107.049 kg)  10/06/13 250 lb (113.399 kg)  09/29/13 250 lb 3.2 oz (113.49 kg)      HEENT: Pupils equal and reactive to light and accommodation. Anicteric sclerae. Normal nasal mucosa.   No oral  lesions,  mouth clear,  oropharynx clear, no postnasal drip. (-) Oral thrush. No dental caries.  Airway - Mallampati class III-IV  Neck: No masses. Midline trachea. No JVD, (-) LAD. (-) bruits appreciated.  Respiratory/Chest: Grossly normal chest. (-) deformity. (-) Accessory muscle use.  Symmetric expansion. (-) Tenderness on palpation.  Resonant on percussion.  Diminished BS on both lower lung zones. (-) wheezing,  Rhonchi  (+) occasional crackles bibasilar.  Some wheezing posteriorly.  (-) egophony  Cardiovascular: Regular rate and  rhythm, heart sounds normal, no murmur or gallops, Gr 1 edema  Gastrointestinal:  Normal bowel sounds. Soft, non-tender. No hepatosplenomegaly.  (-) masses.   Musculoskeletal:  Normal muscle tone. Normal gait.   Extremities: Grossly normal.  (-) clubbing, cyanosis.  Gr 1  edema  Skin: (-) rash,lesions seen.   Neurological/Psychiatric : alert, oriented to time, place, person. Normal mood and affect            Assessment & Plan:  Interstitial lung disease ([place]) Pt with worsening SOB x 3 yrs, worse the last 6 mos. Some better with combivent. Has SOB with minimal exertion/rest. With wheezing. Chest CT scan (05/2016) > not classic IPF. GGlike infiltrates, bilateral. Some mild bronchiectasis. Air trapping. CXR in 05/2016 showed changes c/w ct scan. CXR in 2015 was similar to CXR in 2017.  Differentials : 1. ILD. Less likely IPF but possible. NSIP per radiology report. Chronic HP.  2. Related to CTD. 3. Pulmonary edema.  4. There can be COPD on top of findings 2/2 air trapping. 5. Less likely CA 6. Less likely fungal pna but daughter mentioned about repeated infection with fungus  Plan : 1. Hold off on bronch and bx for now. 2. Extensive blood w/u for CTD. 3. Check fungal labs.  4. Try to get sputum sample > difficult 5. Will Rx as possible COPD.  6. Trial with prednsione 7. Needs echo 8. Needs ONO and determine O2 requirement at sleep.   COPD (chronic obstructive pulmonary disease) ([place]) Non smoker but had 2nd hand smoke 2/2 husband. Air trapping in chest ct scan. Recent worse SOB and wheezing. Will Rx as copd pending w/u.  Plan : 1. pulmicort 0.5 mg BID 2. duoneb qid 3. Trial with pred 20 mg/d x 1 week and see if it helps with SOB/wheezing 4. Pft, abg, ONO 5. Need to ask re: vaccines on f/u.   Exertional dyspnea R/O CHF R/O COPD Plan for echo, pft, abg, ONO.   Hypersomnia Pt with hypersomnia, obesity, crowded airway. Occasional gasping. Pt said she will NOT tolerate cpap. Get ONO first > will need to convince pt if with sig O2  desatn.   Obesity Weight reduction.     I personally reviewed previous images (Chest Xray, Chest Ct scan) done on this patient. I reviewed the reports on the images as  well.    Thank you very much for letting me participate in this patient's care. Please do not hesitate to give me a call if you have any questions or concerns regarding the treatment plan.   Patient will follow up with me in 6-8 weeks, sooner if with issues.     Monica Becton, MD 06/19/2016   5:33 AM [facility] and [place] Pager: [phone] Office: [phone], Fax: [phone]

## 2016-06-19 DIAGNOSIS — R0609 Other forms of dyspnea: Secondary | ICD-10-CM

## 2016-06-19 DIAGNOSIS — J449 Chronic obstructive pulmonary disease, unspecified: Secondary | ICD-10-CM | POA: Insufficient documentation

## 2016-06-19 DIAGNOSIS — G4710 Hypersomnia, unspecified: Secondary | ICD-10-CM | POA: Insufficient documentation

## 2016-06-19 DIAGNOSIS — J849 Interstitial pulmonary disease, unspecified: Secondary | ICD-10-CM | POA: Insufficient documentation

## 2016-06-19 LAB — ANTI-JO 1 ANTIBODY, IGG

## 2016-06-19 LAB — ANTI-SMITH ANTIBODY: ENA SM Ab Ser-aCnc: <1

## 2016-06-19 LAB — ANTI-SCLERODERMA ANTIBODY: Scleroderma (Scl-70) (ENA) Antibody, IgG: <1

## 2016-06-19 LAB — SJOGREN'S SYNDROME ANTIBODS(SSA + SSB)
SSA (RO) (ENA) ANTIBODY, IGG: NEGATIVE
SSB (La) (ENA) Antibody, IgG: <1

## 2016-06-19 LAB — ANTI-DNA ANTIBODY, DOUBLE-STRANDED: ds DNA Ab: <1 IU/mL

## 2016-06-19 LAB — CRYPTOCOCCAL AG, LTX SCR RFLX TITER: CRYPTOCOCCAL AG SCREEN: NOT DETECTED

## 2016-06-19 LAB — ANA: ANA: NEGATIVE

## 2016-06-19 LAB — CYCLIC CITRUL PEPTIDE ANTIBODY, IGG: Cyclic Citrullin Peptide Ab: <16 Units

## 2016-06-19 LAB — ANCA SCREEN W REFLEX TITER: ANCA SCREEN: NEGATIVE

## 2016-06-19 LAB — RNP ANTIBODY: RIBONUCLEIC PROTEIN(ENA) ANTIBODY, IGG: NEGATIVE

## 2016-06-19 NOTE — Assessment & Plan Note (Signed)
R/O CHF R/O COPD Plan for echo, pft, abg, ONO.

## 2016-06-19 NOTE — Assessment & Plan Note (Signed)
Pt with worsening SOB x 3 yrs, worse the last 6 mos. Some better with combivent. Has SOB with minimal exertion/rest. With wheezing. Chest CT scan (05/2016) > not classic IPF. GGlike infiltrates, bilateral. Some mild bronchiectasis. Air trapping. CXR in 05/2016 showed changes c/w ct scan. CXR in 2015 was similar to CXR in 2017.  Differentials : 1. ILD. Less likely IPF but possible. NSIP per radiology report. Chronic HP.  2. Related to CTD. 3. Pulmonary edema.  4. There can be COPD on top of findings 2/2 air trapping. 5. Less likely CA 6. Less likely fungal pna but daughter mentioned about repeated infection with fungus  Plan : 1. Hold off on bronch and bx for now. 2. Extensive blood w/u for CTD. 3. Check fungal labs.  4. Try to get sputum sample > difficult 5. Will Rx as possible COPD.  6. Trial with prednsione 7. Needs echo 8. Needs ONO and determine O2 requirement at sleep.

## 2016-06-19 NOTE — Assessment & Plan Note (Signed)
Weight reduction 

## 2016-06-19 NOTE — Assessment & Plan Note (Signed)
Pt with hypersomnia, obesity, crowded airway. Occasional gasping. Pt said she will NOT tolerate cpap. Get ONO first > will need to convince pt if with sig O2 desatn.

## 2016-06-19 NOTE — Assessment & Plan Note (Signed)
Non smoker but had 2nd hand smoke 2/2 husband. Air trapping in chest ct scan. Recent worse SOB and wheezing. Will Rx as copd pending w/u.  Plan : 1. pulmicort 0.5 mg BID 2. duoneb qid 3. Trial with pred 20 mg/d x 1 week and see if it helps with SOB/wheezing 4. Pft, abg, ONO 5. Need to ask re: vaccines on f/u.

## 2016-06-20 LAB — ASPERGILLUS ANTIGEN,SERUM
Aspergillus Ag, EIA: NOT DETECTED
Index Value: 0.11 (ref <0.50)

## 2016-06-20 LAB — ALDOLASE: ALDOLASE: 4 U/L (ref <=8.1)

## 2016-06-22 LAB — HISTOPLASMA ANTIBODIES: Histoplasma Ab, Immunodiffusion: NEGATIVE

## 2016-06-24 ENCOUNTER — Institutional Professional Consult (permissible substitution): Payer: Medicare Other | Admitting: Internal Medicine

## 2016-06-26 DIAGNOSIS — J449 Chronic obstructive pulmonary disease, unspecified: Secondary | ICD-10-CM | POA: Diagnosis not present

## 2016-06-27 ENCOUNTER — Telehealth: Payer: Self-pay | Admitting: Pulmonary Disease

## 2016-06-27 NOTE — Telephone Encounter (Signed)
Called and spoke with pts daughter and she stated that the pt is on day 5 of the prednisone and the pt is way too peppy on this medication. She started the other 2 meds yesterday.  pts daughter feels that the prednisone maybe should be reduced or stopped due to the pt being very peppy and this is not normal for the pt. Cough is much better.     Allergies  Allergen Reactions  . Pantoprazole     Pt feels it has been causing some diarrhea  . Spiriva [Tiotropium Bromide Monohydrate]     Pt feels that this causes constipation  . Tramadol Nausea Only

## 2016-06-29 NOTE — Telephone Encounter (Signed)
   Pls ask pt if the prednisone helped with breathing and cough. If so, she can cut down the dose in 1/2 and wean off after 5 days.  If it did not help, pls cut dose in 1/2 and stop in 2-3 days. Thanks.   AD

## 2016-06-30 NOTE — Telephone Encounter (Signed)
Spoke with pt's daughter and advised of Dr Corrie Dandy recommendations.  Nothing further needed at this time.

## 2016-06-30 NOTE — Telephone Encounter (Signed)
LVM for pt to return call

## 2016-06-30 NOTE — Telephone Encounter (Signed)
pls tell the pt and daughter to hold off on the prednisone as she has gotten better. Thanks.  AD

## 2016-06-30 NOTE — Telephone Encounter (Signed)
Pt's daughter Baxter Flattery returned call She stated that pt is doing well since last ov with marked improvements on her new neb meds.  When I discussed with Baxter Flattery about pt decreasing her prednisone, Baxter Flattery stated that pt has not taken her prednisone since Saturday (today will be the 3rd missed dose).  Baxter Flattery stated that pt is still doing well despite no prednisone.  Dr Corrie Dandy, would you like pt to continue as she is or does she need to restart at that decreased dose to wean off?  Please advise, thank you.

## 2016-07-03 ENCOUNTER — Other Ambulatory Visit (HOSPITAL_COMMUNITY): Payer: Medicare Other

## 2016-07-10 ENCOUNTER — Other Ambulatory Visit (HOSPITAL_COMMUNITY): Payer: Medicare Other

## 2016-07-22 ENCOUNTER — Telehealth: Payer: Self-pay | Admitting: Pulmonary Disease

## 2016-07-22 NOTE — Telephone Encounter (Signed)
   pls tell pt ONO showed significant o2 desatn.   We can do home sleep test to check if she has OSA or sleep apnea causing this. She said she will NOT tolerate cpap. If she anticipates she will not tolerate cpap, then do not do home sleep test then and start pt on O2 2L at HS.   Thanks.  Monica Becton, MD 07/22/2016, 7:25 AM [facility] Pager [phone] After 3 pm or if no answer, call [phone]

## 2016-07-22 NOTE — Telephone Encounter (Signed)
LMTCB

## 2016-07-23 NOTE — Telephone Encounter (Signed)
[phone] pt duaghter Baxter Flattery cb

## 2016-07-23 NOTE — Telephone Encounter (Signed)
lmtcb x1 for pt's daughter, Shelia Gardner.

## 2016-07-24 NOTE — Telephone Encounter (Signed)
Letter sent asking pt to contact office. Closing encounter per protocol.

## 2016-07-24 NOTE — Telephone Encounter (Signed)
Pt daughter Baxter Flattery [phone]

## 2016-07-24 NOTE — Telephone Encounter (Signed)
LMTCB x 1 

## 2016-07-25 ENCOUNTER — Encounter: Payer: Self-pay | Admitting: Pulmonary Disease

## 2016-07-29 ENCOUNTER — Telehealth: Payer: Self-pay | Admitting: Pulmonary Disease

## 2016-07-29 DIAGNOSIS — J439 Emphysema, unspecified: Secondary | ICD-10-CM

## 2016-07-29 NOTE — Telephone Encounter (Signed)
Patients daughter notified of results. Order entered for Oxygen. Patient notified of results and aware that home company will contact her to schedule a time to deliver oxygen. Nothing further needed.

## 2016-08-01 ENCOUNTER — Telehealth: Payer: Self-pay | Admitting: Pulmonary Disease

## 2016-08-01 NOTE — Telephone Encounter (Signed)
Called spoke with Leafy Ro at [facility]. She states that the ov exceeds the 30 day limit and pt will need a new ov and ONO order on RA. I explained to her that I AD would be in the office next week and I can get her worked in. She voiced understanding and had no further questions.   Called and spoke with pt. I scheduled ov with AD on 08/07/16. She voiced understanding and had no further questions. Nothing further needed.

## 2016-08-07 ENCOUNTER — Encounter (INDEPENDENT_AMBULATORY_CARE_PROVIDER_SITE_OTHER): Payer: Self-pay

## 2016-08-07 ENCOUNTER — Ambulatory Visit (INDEPENDENT_AMBULATORY_CARE_PROVIDER_SITE_OTHER): Payer: Medicare Other | Admitting: Pulmonary Disease

## 2016-08-07 ENCOUNTER — Encounter: Payer: Self-pay | Admitting: Pulmonary Disease

## 2016-08-07 VITALS — BP 128/78 | HR 95 | Ht 62.0 in | Wt 243.0 lb

## 2016-08-07 DIAGNOSIS — G4710 Hypersomnia, unspecified: Secondary | ICD-10-CM

## 2016-08-07 DIAGNOSIS — R0609 Other forms of dyspnea: Secondary | ICD-10-CM | POA: Diagnosis not present

## 2016-08-07 DIAGNOSIS — E669 Obesity, unspecified: Secondary | ICD-10-CM

## 2016-08-07 DIAGNOSIS — J430 Unilateral pulmonary emphysema [MacLeod's syndrome]: Secondary | ICD-10-CM

## 2016-08-07 DIAGNOSIS — J849 Interstitial pulmonary disease, unspecified: Secondary | ICD-10-CM

## 2016-08-07 NOTE — Assessment & Plan Note (Signed)
Non smoker but had 2nd hand smoke 2/2 husband. Air trapping in chest ct scan. Recent worse SOB and wheezing. Will Rx as copd pending w/u.  Plan : 1. pulmicort 0.5 mg BID 2. duoneb qid 3. Pft, abg 4. UTD with vaccines. Mentioned to her re: flu shot.

## 2016-08-07 NOTE — Progress Notes (Signed)
Subjective:    Patient ID: Shelia Gardner, female    DOB: 07/01/1930, 82 y.o.   MRN: [id number]       This is the case of Shelia Gardner, 82 y.o. Female, who was referred by Dr. Lajean Manes  in consultation regarding abnormal chest CT scan.   As you very well know, patient is a non smoker but exposed to 2nd hand smoke with father and husband.  Questionable  History of COPD.   She has been gradually SOB the last 3 yrs, worse the last 1/2 year.  She was busy with her husband who recently passed away so she was not really following up on doctors for her SOB.  She was started on combivent 2P QID the last month and it helped some.  Still remains SOB for the most part.  This weekend, she was so SOB with wheezing, the daughter thought pt would need to go to ER.  She eventually improved with meds and rest.   Pt also has on and off yeast inection in both her thighs.  She has taken PO antifungal as well as antifungal cream.   Pt has lived at her current house for 8-9 yrs.  She lives by herself now.  Daughter lives next door.  She had pet birds but they got rid of them in the fall.  She has a cat which she has had x 4 yrs. (-) dogs. Denies molds or stagnant water. (-) Carpets.  Denies anything in the environment she might be allergic to.  Denies PTB exposure.   Daughter watches pt when she sleeps. Some snoring and gasping.  Pt has some hypersomnia in am but not too bad.  Denies recent hospitalization. No recent Abx.   ROV (08/07/16) Patient returns to the office as follow-up on her dyspnea and abnormal CT scan. Since last seen, she tried her prednisone for a week which made her feel better as far as the dyspnea and cough is concerned. She doesn't necessarily have any subjective complaints. She ended up doing an overnight oximetry which shows significant oxygen desaturation. We try to order oxygen for her but was denied as the test was more than 30 days from the time the order was done. I  discussed with her possibility of sleep apnea and she was okay for CPAP if ever. She is using her nebulizer medicine and feels that is helping her. Has not been admitted not been on antibiotics since last seen.  Review of Systems  Constitutional: Negative.  Negative for fever and unexpected weight change.  HENT: Negative for congestion, dental problem, ear pain, nosebleeds, postnasal drip, rhinorrhea, sinus pressure, sneezing, sore throat and trouble swallowing.   Eyes: Negative.  Negative for redness and itching.  Respiratory: Positive for cough and shortness of breath. Negative for chest tightness and wheezing.   Cardiovascular: Positive for leg swelling. Negative for palpitations.  Gastrointestinal: Negative for nausea and vomiting.  Genitourinary: Negative for dysuria.  Musculoskeletal: Negative.  Negative for joint swelling.  Skin: Negative.  Negative for rash.  Allergic/Immunologic: Negative.   Neurological: Negative.  Negative for headaches.  Hematological: Negative.  Does not bruise/bleed easily.  Psychiatric/Behavioral: Negative for dysphoric mood. The patient is not nervous/anxious.   All other systems reviewed and are negative.     Objective:   Physical Exam  Vitals:  Vitals:   08/07/16 1218  BP: 128/78  Pulse: 95  SpO2: 91%  Weight: 243 lb (110.2 kg)  Height: 5\' 2"  (1.575 m)  Constitutional/General:  Pleasant, well-nourished, well-developed, not in any distress,  Comfortably seating on her wheelchair.   Well kempt Morbidly obese  Body mass index is 44.45 kg/m. Wt Readings from Last 3 Encounters:  08/07/16 243 lb (110.2 kg)  06/18/16 236 lb (107 kg)  10/06/13 250 lb (113.4 kg)      HEENT: Pupils equal and reactive to light and accommodation. Anicteric sclerae. Normal nasal mucosa.   No oral  lesions,  mouth clear,  oropharynx clear, no postnasal drip. (-) Oral thrush. No dental caries.  Airway - Mallampati class III-IV  Neck: No masses. Midline trachea.  No JVD, (-) LAD. (-) bruits appreciated.  Respiratory/Chest: Grossly normal chest. (-) deformity. (-) Accessory muscle use.  Symmetric expansion. (-) Tenderness on palpation.  Resonant on percussion.  Diminished BS on both lower lung zones. (-) wheezing,  Rhonchi  (+) occasional crackles bibasilar.  Some wheezing posteriorly.  (-) egophony  Cardiovascular: Regular rate and  rhythm, heart sounds normal, no murmur or gallops, Gr 1 edema  Gastrointestinal:  Normal bowel sounds. Soft, non-tender. No hepatosplenomegaly.  (-) masses.   Musculoskeletal:  Normal muscle tone. Normal gait.   Extremities: Grossly normal. (-) clubbing, cyanosis.  Gr 1  edema  Skin: (-) rash,lesions seen.   Neurological/Psychiatric : alert, oriented to time, place, person. Normal mood and affect            Assessment & Plan:  Interstitial lung disease ([place]) Pt with worsening SOB x 3 yrs, worse the last 6 mos. Some better with combivent. Has SOB with minimal exertion/rest. With wheezing. Chest CT scan (05/2016) > not classic IPF. GGlike infiltrates, bilateral. Some mild bronchiectasis. Air trapping. CXR in 05/2016 showed changes c/w ct scan. CXR in 2015 was similar to CXR in 2017.  Differentials : 1. ILD. Less likely IPF but possible. NSIP per radiology report. Chronic HP.  2. Less likely related to CTD. CTD w/u was all (-).  3. Possible volume overload issues/pulm edema.  4. There can be COPD on top of findings 2/2 air trapping. 5. Less likely CA 6. Less likely fungal pna but daughter mentioned about repeated infection with fungus  Plan : 1. Patient states she is at baseline now. She had a course of prednisone 30 mg a day for 1 week and she improved. May need prednisone later on. Potentially will need a repeat chest CT scan December/2017 as a follow-up or sooner if more symptomatic. 2. CTD w/u was all (-). Plan for echo, PFT.  3. Rx COPD with neb meds.  4. She is UTD with vaccines. Advised on flu  shot.  5. May need a walk test if she gets more ambulatory.   COPD (chronic obstructive pulmonary disease) ([place]) Non smoker but had 2nd hand smoke 2/2 husband. Air trapping in chest ct scan. Recent worse SOB and wheezing. Will Rx as copd pending w/u.  Plan : 1. pulmicort 0.5 mg BID 2. duoneb qid 3. Pft, abg 4. UTD with vaccines. Mentioned to her re: flu shot.   Exertional dyspnea R/O CHF R/O COPD Plan for echo, pft, abg, HST.   Hypersomnia Pt with hypersomnia, obesity, crowded airway. Occasional gasping. Pt said can tolerate cpap. Husband had OSA.  Plan for HST. If positive, we'll try an auto CPAP. Need to determine also she'll need oxygen with her CPAP machine.   Obesity Weight reduction     Patient will follow up with me in 8 weeks, sooner if with issues.     Elsie Saas  Radford Pax, MD 08/07/2016   1:27 PM Pulmonary and [place] Pager: [phone] Office: [phone], Fax: [phone]

## 2016-08-07 NOTE — Assessment & Plan Note (Signed)
Weight reduction 

## 2016-08-07 NOTE — Assessment & Plan Note (Signed)
R/O CHF R/O COPD Plan for echo, pft, abg, HST.

## 2016-08-07 NOTE — Patient Instructions (Signed)
It was a pleasure taking care of you today!  We will schedule you to have a sleep study to determine if you have sleep apnea.   We will get a home sleep test.  You will be instructed to come back to the office to get an apparatus to sleep with overnight.  Once we have the apparatus, it will usually take [place] 1-2 weeks to read the study and get back at you with results of the test.  Please give [place] a call in 2 weeks after your study if you do not hear back from [place].    If the sleep study is positive, we will order you a CPAP  machine.  Please call the office if you do NOT receive your machine in the next 1-2 weeks.   Please make sure you use your CPAP device everytime you sleep.  We will monitor the usage of your machine per your insurance requirement.  Your insurance company may take the machine from you if you are not using it regularly.   Please clean the mask, tubings, filter, water reservoir with soapy water every week.  Please use distilled water for the water reservoir.   Please call the office or your machine provider (DME company) if you are having issues with the device.   Continue other medicines for your COPD. Pulmicort neb 2x/d and Duoneb 4x/day. Rinse mouth with Pulmicort use.  Do your 2-D echo and breathing test as scheduled.  Return to clinic in 8-10 weeks.

## 2016-08-07 NOTE — Assessment & Plan Note (Signed)
Pt with hypersomnia, obesity, crowded airway. Occasional gasping. Pt said can tolerate cpap. Husband had OSA.  Plan for HST. If positive, we'll try an auto CPAP. Need to determine also she'll need oxygen with her CPAP machine.

## 2016-08-07 NOTE — Assessment & Plan Note (Signed)
Pt with worsening SOB x 3 yrs, worse the last 6 mos. Some better with combivent. Has SOB with minimal exertion/rest. With wheezing. Chest CT scan (05/2016) > not classic IPF. GGlike infiltrates, bilateral. Some mild bronchiectasis. Air trapping. CXR in 05/2016 showed changes c/w ct scan. CXR in 2015 was similar to CXR in 2017.  Differentials : 1. ILD. Less likely IPF but possible. NSIP per radiology report. Chronic HP.  2. Less likely related to CTD. CTD w/u was all (-).  3. Possible volume overload issues/pulm edema.  4. There can be COPD on top of findings 2/2 air trapping. 5. Less likely CA 6. Less likely fungal pna but daughter mentioned about repeated infection with fungus  Plan : 1. Patient states she is at baseline now. She had a course of prednisone 30 mg a day for 1 week and she improved. May need prednisone later on. Potentially will need a repeat chest CT scan December/2017 as a follow-up or sooner if more symptomatic. 2. CTD w/u was all (-). Plan for echo, PFT.  3. Rx COPD with neb meds.  4. She is UTD with vaccines. Advised on flu shot.  5. May need a walk test if she gets more ambulatory.

## 2016-08-08 ENCOUNTER — Ambulatory Visit (HOSPITAL_COMMUNITY): Payer: Medicare Other | Attending: Internal Medicine

## 2016-08-08 ENCOUNTER — Other Ambulatory Visit: Payer: Self-pay

## 2016-08-08 DIAGNOSIS — I340 Nonrheumatic mitral (valve) insufficiency: Secondary | ICD-10-CM | POA: Insufficient documentation

## 2016-08-08 DIAGNOSIS — E669 Obesity, unspecified: Secondary | ICD-10-CM | POA: Diagnosis not present

## 2016-08-08 DIAGNOSIS — D649 Anemia, unspecified: Secondary | ICD-10-CM | POA: Insufficient documentation

## 2016-08-08 DIAGNOSIS — R0600 Dyspnea, unspecified: Secondary | ICD-10-CM

## 2016-08-08 DIAGNOSIS — I071 Rheumatic tricuspid insufficiency: Secondary | ICD-10-CM | POA: Insufficient documentation

## 2016-08-08 DIAGNOSIS — I119 Hypertensive heart disease without heart failure: Secondary | ICD-10-CM | POA: Insufficient documentation

## 2016-08-08 DIAGNOSIS — H3500 Unspecified background retinopathy: Secondary | ICD-10-CM | POA: Diagnosis not present

## 2016-08-08 DIAGNOSIS — H2513 Age-related nuclear cataract, bilateral: Secondary | ICD-10-CM | POA: Diagnosis not present

## 2016-08-08 DIAGNOSIS — Z6841 Body Mass Index (BMI) 40.0 and over, adult: Secondary | ICD-10-CM | POA: Diagnosis not present

## 2016-08-08 DIAGNOSIS — J449 Chronic obstructive pulmonary disease, unspecified: Secondary | ICD-10-CM | POA: Insufficient documentation

## 2016-08-08 DIAGNOSIS — H524 Presbyopia: Secondary | ICD-10-CM | POA: Diagnosis not present

## 2016-08-15 ENCOUNTER — Encounter (INDEPENDENT_AMBULATORY_CARE_PROVIDER_SITE_OTHER): Payer: Medicare Other | Admitting: Pulmonary Disease

## 2016-08-15 ENCOUNTER — Ambulatory Visit: Payer: Medicare Other | Admitting: Pulmonary Disease

## 2016-08-15 DIAGNOSIS — R0600 Dyspnea, unspecified: Secondary | ICD-10-CM

## 2016-08-15 LAB — PULMONARY FUNCTION TEST
DL/VA % PRED: 98 %
DL/VA: 4.47 ml/min/mmHg/L
DLCO COR: 12.89 ml/min/mmHg
DLCO UNC % PRED: 63 %
DLCO cor % pred: 59 %
DLCO unc: 13.68 ml/min/mmHg
FEF 25-75 POST: 1.49 L/s
FEF 25-75 Pre: 1.05 L/sec
FEF2575-%CHANGE-POST: 42 %
FEF2575-%PRED-POST: 135 %
FEF2575-%Pred-Pre: 95 %
FEV1-%CHANGE-POST: 7 %
FEV1-%Pred-Post: 65 %
FEV1-%Pred-Pre: 60 %
FEV1-Post: 1.06 L
FEV1-Pre: 0.98 L
FEV1FVC-%CHANGE-POST: 2 %
FEV1FVC-%PRED-PRE: 116 %
FEV6-%Change-Post: 5 %
FEV6-%Pred-Post: 58 %
FEV6-%Pred-Pre: 55 %
FEV6-Post: 1.22 L
FEV6-Pre: 1.15 L
FEV6FVC-%PRED-POST: 106 %
FEV6FVC-%Pred-Pre: 106 %
FVC-%Change-Post: 5 %
FVC-%PRED-POST: 54 %
FVC-%PRED-PRE: 52 %
FVC-PRE: 1.15 L
FVC-Post: 1.22 L
POST FEV1/FVC RATIO: 87 %
Post FEV6/FVC ratio: 100 %
Pre FEV1/FVC ratio: 85 %
Pre FEV6/FVC Ratio: 100 %
RV % pred: 100 %
RV: 2.36 L
TLC % pred: 76 %
TLC: 3.63 L

## 2016-08-18 NOTE — Progress Notes (Signed)
Called spoke with pt's daughter. Reviewed results and recs. Pt voiced understanding and had no further questions.

## 2016-08-27 DIAGNOSIS — G4733 Obstructive sleep apnea (adult) (pediatric): Secondary | ICD-10-CM | POA: Diagnosis not present

## 2016-08-28 ENCOUNTER — Telehealth: Payer: Self-pay | Admitting: Pulmonary Disease

## 2016-08-28 DIAGNOSIS — G4733 Obstructive sleep apnea (adult) (pediatric): Secondary | ICD-10-CM | POA: Diagnosis not present

## 2016-08-28 NOTE — Telephone Encounter (Signed)
  Please call the pt and tell the pt the [place]  showed OSA   Pt stops breathing 18   times an hour.   Home sleep study was done on : 08/27/16  Please order autoCPAP 5-15 cm H2O. Patient will need a mask fitting session. Patient will need a 1 month download.   Patient needs to be seen by me or any of the NPs/APPs  4-6 weeks after obtaining the cpap machine. Let me know if you receive this.   Thanks!   J. Shirl Harris, MD 08/28/2016, 1:19 PM

## 2016-08-29 ENCOUNTER — Other Ambulatory Visit: Payer: Self-pay | Admitting: *Deleted

## 2016-08-29 DIAGNOSIS — G4710 Hypersomnia, unspecified: Secondary | ICD-10-CM

## 2016-09-01 NOTE — Telephone Encounter (Signed)
LMTCB

## 2016-09-02 ENCOUNTER — Telehealth: Payer: Self-pay | Admitting: Pulmonary Disease

## 2016-09-02 DIAGNOSIS — G4733 Obstructive sleep apnea (adult) (pediatric): Secondary | ICD-10-CM

## 2016-09-02 NOTE — Telephone Encounter (Signed)
Patient's daughter notified of Sleep Study results.  Order entered for CPAP machine.  Patient's daughter will call to schedule appointment for follow up once the CPAP Machine has been set up. Nothing further needed.

## 2016-09-10 ENCOUNTER — Telehealth: Payer: Self-pay | Admitting: Pulmonary Disease

## 2016-09-10 DIAGNOSIS — J849 Interstitial pulmonary disease, unspecified: Secondary | ICD-10-CM

## 2016-09-10 DIAGNOSIS — G4733 Obstructive sleep apnea (adult) (pediatric): Secondary | ICD-10-CM

## 2016-09-10 NOTE — Telephone Encounter (Signed)
  Plan for this pt : 1. Needs to get cpap and do a download to make sure OSA is corrected. 2. Once DL is done and OSA is corrected, she will need a lab study with her cpap to rpove to insurance whether she will need o2 with cpa or not.    Monica Becton, MD 09/10/2016, 5:27 PM [facility] Pager [phone] After 3 pm or if no answer, call [phone]

## 2016-09-10 NOTE — Telephone Encounter (Signed)
Called and spoke with pts daughter and she is wondering what the hold up with the orders with [facility].  She is asking about the oxygen and the cpap.  I advised her that I would call and find out what was going on.    Called and spoke with [facility] and they stated that the pts insurance will not cover any type of oxygen when the OV note speaks of OSA.  They stated that in order to get the pt to have the oxygen and the cpap pt wil either have to do:  1.  Qualify for 24/7 oxygen in the office 2.  Have a cpap titration done.  I called to make the pts daughter aware of this and had to lmomtcb   AD please advise what you would like to do.  They are working on getting the cpap order done now.  Please advise. thanks

## 2016-09-10 NOTE — Telephone Encounter (Signed)
Patient daughter called states returning call - She can be reached at [phone]

## 2016-09-11 NOTE — Telephone Encounter (Signed)
LMTCB  Pt also has PFT results.

## 2016-09-11 NOTE — Telephone Encounter (Signed)
Spoke with pt's daughter, Baxter Flattery, gave results and recommendations. Order for CPAP titration/ONO placed. Nothing further needed.

## 2016-09-11 NOTE — Telephone Encounter (Signed)
LMTCB

## 2016-09-11 NOTE — Telephone Encounter (Signed)
Spoke with pt's daughter, Baxter Flattery, and gave results and recommendations. Order for CPAP titration/ONO placed. Nothing further needed.

## 2016-09-11 NOTE — Addendum Note (Signed)
Addended by: Beckie Busing on: 09/11/2016 02:28 PM   Modules accepted: Orders

## 2016-09-11 NOTE — Telephone Encounter (Signed)
Pt's daughter is returning call. She can be reached at [phone]

## 2016-09-26 DIAGNOSIS — K5901 Slow transit constipation: Secondary | ICD-10-CM | POA: Diagnosis not present

## 2016-09-26 DIAGNOSIS — H6122 Impacted cerumen, left ear: Secondary | ICD-10-CM | POA: Diagnosis not present

## 2016-09-26 DIAGNOSIS — N183 Chronic kidney disease, stage 3 (moderate): Secondary | ICD-10-CM | POA: Diagnosis not present

## 2016-09-26 DIAGNOSIS — I129 Hypertensive chronic kidney disease with stage 1 through stage 4 chronic kidney disease, or unspecified chronic kidney disease: Secondary | ICD-10-CM | POA: Diagnosis not present

## 2016-09-29 ENCOUNTER — Telehealth: Payer: Self-pay | Admitting: Pulmonary Disease

## 2016-09-29 DIAGNOSIS — G4733 Obstructive sleep apnea (adult) (pediatric): Secondary | ICD-10-CM

## 2016-09-29 NOTE — Telephone Encounter (Signed)
Spoke with pt's daughter.  Appt on 10/02/16 canceled because pt needs f/u once she has started cpap.  Daughter states that [facility] has been out to help get pt started but now they are saying they need another order from Dr Corrie Dandy to change settings form 5-15 to 8-15.  Advised daughter that we will call [facility] 10/24 to inquire about this.

## 2016-09-29 NOTE — Telephone Encounter (Signed)
lmtcb for Baxter Flattery, pt's daughter.

## 2016-10-01 NOTE — Telephone Encounter (Signed)
   I have signed paperwork.  Pls ask Ashtyn if she has it.  Let me know if she does not have it.   OK to change cpap pressure to 8-15 cm water per pt request.   Thanks.  Monica Becton, MD 10/01/2016, 2:05 PM [facility] Pager [phone] After 3 pm or if no answer, call [phone]

## 2016-10-01 NOTE — Telephone Encounter (Signed)
Called and spoke with [facility] and they stated that they are still waiting on the CMN to be signed and sent back.    [facility] also stated that they asked the pt to call and find out if the pressure can be changed from 5-15 to 8-15.  AD please advise. thanks

## 2016-10-02 ENCOUNTER — Ambulatory Visit: Payer: Medicare Other | Admitting: Pulmonary Disease

## 2016-10-02 NOTE — Telephone Encounter (Signed)
Forms were faxed back for nebulizer meds.   Order placed for CPAP pressure change.  Nothing further needed.

## 2016-10-03 ENCOUNTER — Telehealth: Payer: Self-pay | Admitting: Pulmonary Disease

## 2016-10-03 NOTE — Telephone Encounter (Signed)
Order was placed with [facility] to have the cpap settings changed per AD.  Called and spoke with pts daughter and she is aware that order was sent to [facility] yesterday and if she does not hear back from them, to call and let us know.

## 2016-10-24 DIAGNOSIS — H6123 Impacted cerumen, bilateral: Secondary | ICD-10-CM | POA: Diagnosis not present

## 2016-10-24 DIAGNOSIS — H9193 Unspecified hearing loss, bilateral: Secondary | ICD-10-CM | POA: Diagnosis not present

## 2016-10-24 DIAGNOSIS — J3089 Other allergic rhinitis: Secondary | ICD-10-CM | POA: Diagnosis not present

## 2016-11-27 DIAGNOSIS — R05 Cough: Secondary | ICD-10-CM | POA: Diagnosis not present

## 2016-11-27 DIAGNOSIS — F4320 Adjustment disorder, unspecified: Secondary | ICD-10-CM | POA: Diagnosis not present

## 2016-11-27 DIAGNOSIS — J209 Acute bronchitis, unspecified: Secondary | ICD-10-CM | POA: Diagnosis not present

## 2016-11-27 DIAGNOSIS — Z634 Disappearance and death of family member: Secondary | ICD-10-CM | POA: Diagnosis not present

## 2016-11-27 DIAGNOSIS — J984 Other disorders of lung: Secondary | ICD-10-CM | POA: Diagnosis not present

## 2016-12-17 DIAGNOSIS — D72823 Leukemoid reaction: Secondary | ICD-10-CM | POA: Diagnosis not present

## 2016-12-17 DIAGNOSIS — I872 Venous insufficiency (chronic) (peripheral): Secondary | ICD-10-CM | POA: Diagnosis not present

## 2016-12-17 DIAGNOSIS — K5901 Slow transit constipation: Secondary | ICD-10-CM | POA: Diagnosis not present

## 2016-12-17 DIAGNOSIS — K921 Melena: Secondary | ICD-10-CM | POA: Diagnosis not present

## 2017-04-20 DIAGNOSIS — J449 Chronic obstructive pulmonary disease, unspecified: Secondary | ICD-10-CM | POA: Diagnosis not present

## 2017-04-20 DIAGNOSIS — K111 Hypertrophy of salivary gland: Secondary | ICD-10-CM | POA: Diagnosis not present

## 2017-04-20 DIAGNOSIS — Z79899 Other long term (current) drug therapy: Secondary | ICD-10-CM | POA: Diagnosis not present

## 2017-04-20 DIAGNOSIS — R600 Localized edema: Secondary | ICD-10-CM | POA: Diagnosis not present

## 2017-04-20 DIAGNOSIS — I700 Atherosclerosis of aorta: Secondary | ICD-10-CM | POA: Diagnosis not present

## 2017-04-20 DIAGNOSIS — I872 Venous insufficiency (chronic) (peripheral): Secondary | ICD-10-CM | POA: Diagnosis not present

## 2017-04-20 DIAGNOSIS — I129 Hypertensive chronic kidney disease with stage 1 through stage 4 chronic kidney disease, or unspecified chronic kidney disease: Secondary | ICD-10-CM | POA: Diagnosis not present

## 2017-04-20 DIAGNOSIS — N183 Chronic kidney disease, stage 3 (moderate): Secondary | ICD-10-CM | POA: Diagnosis not present

## 2017-04-22 ENCOUNTER — Telehealth: Payer: Self-pay | Admitting: Internal Medicine

## 2017-04-22 NOTE — Telephone Encounter (Signed)
Error.Stanley A Dalton ° °

## 2017-04-27 ENCOUNTER — Telehealth: Payer: Self-pay | Admitting: Internal Medicine

## 2017-04-27 NOTE — Telephone Encounter (Signed)
Spoke with pt's daughter, states that pt's husband was a [facility] pt before he passed away- family respects [facility] and is requesting [facility] evaluate pt as a second opinion- pt understands [facility] is not accepting new pulmonary patients at this time, but is asking that [facility] evaluate pt and then can refer to a new provider to follow up care since AD is leaving the practice.    [facility] please advise.  Thanks.

## 2017-04-28 ENCOUNTER — Ambulatory Visit: Payer: Medicare Other | Admitting: Adult Health

## 2017-04-28 NOTE — Telephone Encounter (Signed)
Called and spoke to pt's daughter, Baxter Flattery. Informed her of the availability with [facility]. Appt made with [facility] on 07/24/2017. Pt verbalized understanding and denied any further questions or concerns at this time.

## 2017-04-28 NOTE — Telephone Encounter (Signed)
I don't think I have anything available any time soon.

## 2017-05-01 DIAGNOSIS — I872 Venous insufficiency (chronic) (peripheral): Secondary | ICD-10-CM | POA: Diagnosis not present

## 2017-05-01 DIAGNOSIS — J8410 Pulmonary fibrosis, unspecified: Secondary | ICD-10-CM | POA: Diagnosis not present

## 2017-05-01 DIAGNOSIS — K047 Periapical abscess without sinus: Secondary | ICD-10-CM | POA: Diagnosis not present

## 2017-07-07 DIAGNOSIS — J8410 Pulmonary fibrosis, unspecified: Secondary | ICD-10-CM | POA: Diagnosis not present

## 2017-07-07 DIAGNOSIS — N183 Chronic kidney disease, stage 3 (moderate): Secondary | ICD-10-CM | POA: Diagnosis not present

## 2017-07-07 DIAGNOSIS — I129 Hypertensive chronic kidney disease with stage 1 through stage 4 chronic kidney disease, or unspecified chronic kidney disease: Secondary | ICD-10-CM | POA: Diagnosis not present

## 2017-07-07 DIAGNOSIS — J449 Chronic obstructive pulmonary disease, unspecified: Secondary | ICD-10-CM | POA: Diagnosis not present

## 2017-07-07 DIAGNOSIS — I872 Venous insufficiency (chronic) (peripheral): Secondary | ICD-10-CM | POA: Diagnosis not present

## 2017-07-24 ENCOUNTER — Ambulatory Visit (INDEPENDENT_AMBULATORY_CARE_PROVIDER_SITE_OTHER): Payer: Medicare Other | Admitting: Internal Medicine

## 2017-07-24 ENCOUNTER — Ambulatory Visit (INDEPENDENT_AMBULATORY_CARE_PROVIDER_SITE_OTHER)
Admission: RE | Admit: 2017-07-24 | Discharge: 2017-07-24 | Disposition: A | Discharge status: Home / Self Care | Payer: Medicare Other | Source: Ambulatory Visit | Attending: Internal Medicine | Admitting: Internal Medicine

## 2017-07-24 ENCOUNTER — Telehealth: Payer: Self-pay | Admitting: Internal Medicine

## 2017-07-24 ENCOUNTER — Other Ambulatory Visit (INDEPENDENT_AMBULATORY_CARE_PROVIDER_SITE_OTHER): Payer: Medicare Other

## 2017-07-24 ENCOUNTER — Encounter: Payer: Self-pay | Admitting: Internal Medicine

## 2017-07-24 VITALS — BP 114/78 | HR 89 | Ht 62.0 in | Wt 246.8 lb

## 2017-07-24 DIAGNOSIS — G4710 Hypersomnia, unspecified: Secondary | ICD-10-CM

## 2017-07-24 DIAGNOSIS — R609 Edema, unspecified: Secondary | ICD-10-CM | POA: Diagnosis not present

## 2017-07-24 DIAGNOSIS — J9611 Chronic respiratory failure with hypoxia: Secondary | ICD-10-CM

## 2017-07-24 DIAGNOSIS — I8290 Acute embolism and thrombosis of unspecified vein: Secondary | ICD-10-CM

## 2017-07-24 DIAGNOSIS — J849 Interstitial pulmonary disease, unspecified: Secondary | ICD-10-CM | POA: Diagnosis not present

## 2017-07-24 DIAGNOSIS — J679 Hypersensitivity pneumonitis due to unspecified organic dust: Secondary | ICD-10-CM

## 2017-07-24 DIAGNOSIS — R0602 Shortness of breath: Secondary | ICD-10-CM | POA: Diagnosis not present

## 2017-07-24 DIAGNOSIS — R05 Cough: Secondary | ICD-10-CM | POA: Diagnosis not present

## 2017-07-24 DIAGNOSIS — J449 Chronic obstructive pulmonary disease, unspecified: Secondary | ICD-10-CM

## 2017-07-24 DIAGNOSIS — R600 Localized edema: Secondary | ICD-10-CM

## 2017-07-24 LAB — CBC WITH DIFFERENTIAL/PLATELET
BASOS ABS: 0.1 10*3/uL (ref 0.0–0.1)
Basophils Relative: 0.8 % (ref 0.0–3.0)
EOS PCT: 7.7 % — AB (ref 0.0–5.0)
Eosinophils Absolute: 0.9 10*3/uL — ABNORMAL HIGH (ref 0.0–0.7)
HCT: 45.1 % (ref 36.0–46.0)
HEMOGLOBIN: 14.6 g/dL (ref 12.0–15.0)
LYMPHS ABS: 1.3 10*3/uL (ref 0.7–4.0)
LYMPHS PCT: 11.1 % — AB (ref 12.0–46.0)
MCHC: 32.5 g/dL (ref 30.0–36.0)
MCV: 91.1 fl (ref 78.0–100.0)
MONOS PCT: 5.9 % (ref 3.0–12.0)
Monocytes Absolute: 0.7 10*3/uL (ref 0.1–1.0)
NEUTROS PCT: 74.5 % (ref 43.0–77.0)
Neutro Abs: 9 10*3/uL — ABNORMAL HIGH (ref 1.4–7.7)
Platelets: 250 10*3/uL (ref 150.0–400.0)
RBC: 4.95 Mil/uL (ref 3.87–5.11)
RDW: 14.7 % (ref 11.5–15.5)
WBC: 12.1 10*3/uL — AB (ref 4.0–10.5)

## 2017-07-24 LAB — BASIC METABOLIC PANEL
BUN: 21 mg/dL (ref 6–23)
CALCIUM: 9.5 mg/dL (ref 8.4–10.5)
CHLORIDE: 102 meq/L (ref 96–112)
CO2: 28 meq/L (ref 19–32)
CREATININE: 1.09 mg/dL (ref 0.40–1.20)
GFR: 50.75 mL/min — ABNORMAL LOW (ref >60.00)
GLUCOSE: 103 mg/dL — AB (ref 70–99)
Potassium: 4 mEq/L (ref 3.5–5.1)
Sodium: 140 mEq/L (ref 135–145)

## 2017-07-24 LAB — BRAIN NATRIURETIC PEPTIDE: Pro B Natriuretic peptide (BNP): 59 pg/mL (ref 0.0–100.0)

## 2017-07-24 NOTE — Assessment & Plan Note (Addendum)
There may be multiple contributing diagnoses including obesity hypoventilation and interstitial lung disease. We discussed home oxygen. She is obese and sedentary with past history of DVT after knee replacement. Not sure her kidneys can handle contrast dye for CT. We will start with d-dimer and consider VQ scan while we recheck kidney function. Plan-ordering oxygen 2 L continuous and portable.

## 2017-07-24 NOTE — Assessment & Plan Note (Addendum)
Primary defect is restriction. Not clear there is a significant obstructive component but I will leave this diagnosis on the problem list pending further workup. Plan-schedule PFT for comparison with prior

## 2017-07-24 NOTE — Telephone Encounter (Signed)
Called and spoke to pt's daughter, Baxter Flattery. Pt was called by [employer] close to 5pm and was asked by [facility] if pt will need the O2 today or if it can wait till Monday, the pt unknowingly agreed to waiting till Monday. Joellen Jersey has spoken [employer], will forward to her to document further and call Baxter Flattery with an update.

## 2017-07-24 NOTE — Patient Instructions (Signed)
Order- DME [facility]- home O2 sleep and portable  Dx chronic respiratory failure with hypoxia  Order- lab- D-Dimer, ACE level, CBC w diff, BMET, BNP, Hypersensitivity Pneumonia panel,, Allergy Profile     Dx Hypersensitivity pneumonia, peripheral edema, DVT  Order- Schedule PFT  Order- CXR  Chronic respiratory failure, Hypersensitivity pneumonia

## 2017-07-24 NOTE — Telephone Encounter (Signed)
Spoke with after hours answering service at [facility] information and put request for Gaspar Bidding (on call for [facility]) to contact me back. Spoke with Gaspar Bidding who stated he would go by patients house tonight to ensure O2 set up is completed.    Estill Bamberg with [employer] called back as well stating she spoke with patient directly today and patient told her that she wanted to wait until Monday since she was not getting a POC. Either way the patient is being set up with her O2 tonight for daytime and QHS. Pt's daughter and Estill Bamberg with [facility] are aware that I will place order for POC on Monday so [facility] can sign order

## 2017-07-24 NOTE — Assessment & Plan Note (Signed)
Etiology is unclear. Radiology and her history of long-term pet bird exposure raises possibility of hypersensitivity pneumonitis. Plan-appropriate lab work including eosinophil count, hypersensitivity panel and allergy profile. CXR today for comparison then decide how soon to do follow-up CT chest.

## 2017-07-24 NOTE — Assessment & Plan Note (Addendum)
Unattended Home Sleep Test 08/27/16-AHI 18.3/hour, desaturation to 56% with average saturation 88%, body weight 243 pounds. She could not get comfortable with CPAP and did not continue. Epworth score 14/24 Plan-we will reevaluate and consider updating sleep study as other workup proceeds

## 2017-07-24 NOTE — Progress Notes (Signed)
07/24/17-83 year old female never smoker (second hand smoke when husband was alive) with history of progressive dyspnea 3 years, interstitial groundglass changes. Had had birds and cat in home previously. Birds are gone now. Had sleep study last year for hypersomnia and snoring. Labs 06/2016-negative for Aspergillus antigen, cryptococcal antigen, Sjogren's, scleroderma, anti-DNA, ANCA, She had her husband had lived for many years in a conspicuously moldy home according to daughter, but has moved to a different home. . Elevated-42-rheumatoid factor. Medical problem list includes COPD, GERD, anemia, interstitial lung disease, CKD III Lacrimal gland biopsy had shown lymphoid hyperplasia Home sleep test 08/27/16-AHI 18.3/hour, desaturation to 56% with average saturation 88%, body weight 243 pounds. She could not get comfortable with CPAP and did not continue. PFT 08/15/16-moderate restriction  with no response to dilator, mild reduction diffusion capacity. FVC 1.22/54%, FEV1 1.06/65%,FEF25-75%  ratio 0.87, TLC 76%, DLCO 63% Pt has been taking prescribed nebulizer meds by Dr. Corrie Dandy which helped temporarily but pt is still having issues.. Pt was on a CPAP machine but is not able to handle the machine due to her breathing. Pt is becoming very SOB easily, even with her going to the bathroom makes her very SOB. Pt also complains of a dry to prod. cough with clear mucus. DME: [facility]. She is a widow of former patient of mine, here with her daughter. Complex history which I reviewed with her, including review of recent image studies and labs, and note from Dr. Murlean Iba. She has been more aware of dyspnea on exertion over the last several months, possibly with fairly abrupt onset but without chest pain or palpitation, fever or sweats.. Chronic leg edema has been worse in the past year. DVT after right total knee replacement 3 or 4 years ago treated with "shots". Occasional cough has been new but minimal. Intermittent  episodes of gagging/retching sensation, not clear if this is airway or reflux related, but usually nonproductive. Right breast cancer with lumpectomy and XRT 20 years ago-no recurrence. Bilateral cervical adenopathy scheduled responded to antibiotics. CT chest 05/14/16- IMPRESSION: 1. Significantly motion degraded study. 2. Interstitial lung disease characterized by patchy reticulation and ground-glass attenuation with mild architectural distortion throughout both lungs, predominantly involving the peribronchovascular lungs with lesser involvement of the subpleural lungs. Prominent air trapping. No significant traction bronchiectasis. No frank honeycombing. No appreciable craniocaudal gradient. These findings are most suggestive of chronic hypersensitivity pneumonitis, with the differential including nonspecific interstitial pneumonia (NSIP). These findings are not consistent with usual interstitial pneumonia (UIP). A follow-up high-resolution chest CT study is recommended in 12 months to assess temporal pattern stability. 3. Mild paratracheal mediastinal lymphadenopathy, nonspecific, probably reactive. 4. Additional findings include coronary atherosclerosis, hypodense 1.3 cm left thyroid lobe nodule, mild diffuse hepatic steatosis and Cholelithiasis.  Prior to Admission medications   Medication Sig Start Date End Date Taking? Authorizing Provider  aspirin 81 MG tablet Take 81 mg by mouth daily.   Yes [provider]  budesonide (PULMICORT) 0.5 MG/2ML nebulizer solution Take 2 mLs (0.5 mg total) by nebulization 2 (two) times daily. 06/18/16  Yes de Dios, [place], MD  ipratropium-albuterol (DUONEB) 0.5-2.5 (3) MG/3ML SOLN Take 3 mLs by nebulization 4 (four) times daily. 06/18/16  Yes de [place], [place], MD  lisinopril (PRINIVIL,ZESTRIL) 10 MG tablet Take 5 mg by mouth every morning. TAKES 1/2 TABLET   Yes [provider]  loratadine (CLARITIN) 10 MG tablet Take 10  mg by mouth daily.   Yes [provider]  Multiple Vitamin (  MULTIVITAMIN) tablet Take 1 tablet by mouth daily.   Yes [provider]  Multiple Vitamins-Minerals (OCUVITE PO) Take 1 tablet by mouth daily.   Yes [provider]  Omega-3 Fatty Acids (FISH OIL) 1000 MG CAPS Take 1 capsule by mouth daily.   Yes [provider]  saccharomyces boulardii (FLORASTOR) 250 MG capsule Take 250 mg by mouth daily.   Yes [provider]   Past Medical History:  Diagnosis Date  . Arthritis   . GERD (gastroesophageal reflux disease)   . 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 Hypertension   . Shortness of breath    with exertion   Past Surgical History:  Procedure Laterality Date  . BREAST LUMPECTOMY WITH AXILLARY LYMPH NODE DISSECTION     right breast  . TOTAL KNEE ARTHROPLASTY Right 10/05/2013   Procedure: TOTAL RIGHT  KNEE ARTHROPLASTY;  Surgeon: Tobi Bastos, MD;  Location: [facility];  Service: Orthopedics;  Laterality: Right;   No family history on file. Social History   Social History  . Marital status: Married    Spouse name: N/A  . Number of children: N/A  . Years of education: N/A   Occupational History  . Not on file.   Social History Main Topics  . Smoking status: Never Smoker  . Smokeless tobacco: Not on file  . Alcohol use Yes     Comment: rarely wine  . Drug use: No  . Sexual activity: Not on file   Other Topics Concern  . Not on file   Social History Narrative  . No narrative on file   ROS-see HPI   + = Positive Constitutional:    weight loss, night sweats, fevers, chills, fatigue, lassitude. HEENT:    headaches, difficulty swallowing, tooth/dental problems, sore throat,       sneezing, itching, ear ache, nasal congestion, post nasal drip, snoring CV:    chest pain, orthopnea, PND, + swelling in lower extremities, anasarca,                                                 dizziness,  palpitations Resp:   +shortness of breath with exertion or at rest.                productive cough,   non-productive cough, coughing up of blood.              change in color of mucus.  wheezing.   Skin:    rash or lesions. GI:  No-   heartburn, indigestion, abdominal pain, nausea, vomiting, diarrhea,                 change in bowel habits, loss of appetite GU: dysuria, change in color of urine, no urgency or frequency.   flank pain. MS:   joint pain, stiffness, decreased range of motion, back pain. Neuro-     nothing unusual Psych:  change in mood or affect.  depression or anxiety.   memory loss.  OBJ- Physical Exam  + morbidly obese General- Alert, Oriented, Affect-appropriate, Distress- none acute, + morbidly obese. Room air saturation on arrival 88% at rest. Corrected to 93% on O2 2 L Skin- rash-none, lesions- none, excoriation- none Lymphadenopathy- none Head- atraumatic            Eyes- Gross vision intact,  PERRLA, conjunctivae and secretions clear            Ears- Hearing, canals-normal            Nose- Clear, no-Septal dev, mucus, polyps, erosion, perforation             Throat- Mallampati IV , mucosa clear , drainage- none, tonsils- atrophic Neck- flexible , trachea midline, no stridor , thyroid nl, carotid no bruit Chest - symmetrical excursion , unlabored           Heart/CV- RRR , no murmur , no gallop  , no rub, nl s1 s2                           - JVD- none , edema 3+ with water blisters, stasis changes + pink bilaterally, varices- none           Lung- clear to P&A, wheeze- none, cough- none , dullness-none, rub- none           Chest wall-  Abd-  Br/ Gen/ Rectal- Not done, not indicated Extrem- cyanosis- none, clubbing, none, atrophy- none, strength- nl, Homan's NEG Neuro- grossly intact to observation

## 2017-07-24 NOTE — Assessment & Plan Note (Signed)
Her body weight significantly exacerbates several medical problems. Long-term goal of weight loss.

## 2017-07-24 NOTE — Progress Notes (Deleted)
   Subjective:    Patient ID: Shelia Gardner, female    DOB: 04-30-1930, 83 y.o.   MRN: [id number]  HPI    Review of Systems  Constitutional: Negative for fever and unexpected weight change.  HENT: Negative for congestion, dental problem, ear pain, nosebleeds, postnasal drip, rhinorrhea, sinus pressure, sneezing, sore throat and trouble swallowing.   Eyes: Negative for redness and itching.  Respiratory: Negative for cough, chest tightness, shortness of breath and wheezing.   Cardiovascular: Negative for palpitations and leg swelling.  Gastrointestinal: Negative for nausea and vomiting.  Genitourinary: Negative for dysuria.  Musculoskeletal: Negative for joint swelling.  Skin: Negative for rash.  Allergic/Immunologic: Negative.  Negative for environmental allergies, food allergies and immunocompromised state.  Neurological: Negative for headaches.  Hematological: Does not bruise/bleed easily.  Psychiatric/Behavioral: Negative for dysphoric mood. The patient is not nervous/anxious.        Objective:   Physical Exam

## 2017-07-25 LAB — D-DIMER, QUANTITATIVE: D-Dimer, Quant: 2.29 mcg/mL FEU — ABNORMAL HIGH (ref <0.50)

## 2017-07-27 ENCOUNTER — Other Ambulatory Visit: Payer: Self-pay | Admitting: *Deleted

## 2017-07-27 DIAGNOSIS — R0602 Shortness of breath: Secondary | ICD-10-CM

## 2017-07-27 LAB — RESPIRATORY ALLERGY PROFILE REGION II ~~LOC~~
Allergen, C. Herbarum, M2: <0.1 kU/L
Allergen, D pternoyssinus,d7: <0.1 kU/L
Allergen, Mouse Urine Protein, e78: <0.1 kU/L
Allergen, Oak,t7: <0.1 kU/L
Allergen, P. notatum, m1: <0.1 kU/L
Cat Dander: <0.1 kU/L
IgE (Immunoglobulin E), Serum: 280 kU/L — ABNORMAL HIGH (ref <115)
Pecan/Hickory Tree IgE: <0.1 kU/L
Rough Pigweed  IgE: <0.1 kU/L
Sheep Sorrel IgE: <0.1 kU/L
Timothy Grass: <0.1 kU/L

## 2017-07-27 LAB — ANGIOTENSIN CONVERTING ENZYME: Angiotensin-Converting Enzyme: <5 U/L — ABNORMAL LOW (ref 9–67)

## 2017-07-27 NOTE — Telephone Encounter (Signed)
Order to eval for pt POC has been placed to [facility].  Nothing further needed.

## 2017-07-28 ENCOUNTER — Ambulatory Visit (INDEPENDENT_AMBULATORY_CARE_PROVIDER_SITE_OTHER)
Admission: RE | Admit: 2017-07-28 | Discharge: 2017-07-28 | Disposition: A | Discharge status: Home / Self Care | Payer: Medicare Other | Source: Ambulatory Visit | Attending: Internal Medicine | Admitting: Internal Medicine

## 2017-07-28 DIAGNOSIS — I700 Atherosclerosis of aorta: Secondary | ICD-10-CM | POA: Diagnosis not present

## 2017-07-28 DIAGNOSIS — R0602 Shortness of breath: Secondary | ICD-10-CM

## 2017-07-28 MED ORDER — IOPAMIDOL (ISOVUE-370) INJECTION 76%
80.0000 mL | Freq: Once | INTRAVENOUS | Status: AC | PRN
  Administered 2017-07-28: 80 mL via INTRAVENOUS

## 2017-07-29 LAB — HYPERSENSITIVITY PNEUMONITIS
A. Pullulans Abs: NEGATIVE
A.Fumigatus #1 Abs: NEGATIVE
Micropolyspora faeni, IgG: NEGATIVE
PIGEON SERUM ABS: NEGATIVE
Thermoact. Saccharii: NEGATIVE
Thermoactinomyces vulgaris, IgG: NEGATIVE

## 2017-07-30 NOTE — Progress Notes (Signed)
LMTCB

## 2017-07-31 ENCOUNTER — Telehealth: Payer: Self-pay | Admitting: Internal Medicine

## 2017-07-31 ENCOUNTER — Other Ambulatory Visit: Payer: Self-pay | Admitting: *Deleted

## 2017-07-31 DIAGNOSIS — J9611 Chronic respiratory failure with hypoxia: Secondary | ICD-10-CM

## 2017-07-31 NOTE — Telephone Encounter (Signed)
Called pt and spoke with her daughter Shelia Gardner relaying the result of the hypersensitivity pneumonia lab result and while speaking with Shelia Gardner she said that her mother has been having problems with her nasal cavity clogging up from the oxygen. Stated that they went to the pharmacy and pharmacist recommended trying Afrin to see if that would help. Afrin has helped some but still having issues with nasal cavity becoming clogged up and pt unable to breathe through nose and Shelia Gardner is asking if there is anything else we can do for her mother to help her breathe through her nose better.   CY, can you please advise.

## 2017-07-31 NOTE — Telephone Encounter (Signed)
Called and spoke with pt's daughter Baxter Flattery and told her that we were going to place an order for a humidifier to be added with her oxygen concentrator and that she can also try an otc nasal saline gel to see if that will help as well.  Nothing further needed.

## 2017-07-31 NOTE — Telephone Encounter (Signed)
Ask DME to please provide humidifier for oxygen concentrator  She can try otc nasal saline gel to keep nostrils from over-drying and getting stuffy

## 2017-08-31 ENCOUNTER — Encounter: Payer: Self-pay | Admitting: Internal Medicine

## 2017-08-31 ENCOUNTER — Ambulatory Visit (INDEPENDENT_AMBULATORY_CARE_PROVIDER_SITE_OTHER): Payer: Medicare Other | Admitting: Internal Medicine

## 2017-08-31 ENCOUNTER — Other Ambulatory Visit (INDEPENDENT_AMBULATORY_CARE_PROVIDER_SITE_OTHER): Payer: Medicare Other

## 2017-08-31 ENCOUNTER — Ambulatory Visit: Payer: Medicare Other | Admitting: Internal Medicine

## 2017-08-31 VITALS — BP 122/70 | HR 85 | Ht 62.0 in | Wt 245.0 lb

## 2017-08-31 DIAGNOSIS — J449 Chronic obstructive pulmonary disease, unspecified: Secondary | ICD-10-CM

## 2017-08-31 DIAGNOSIS — J849 Interstitial pulmonary disease, unspecified: Secondary | ICD-10-CM | POA: Diagnosis not present

## 2017-08-31 DIAGNOSIS — J9611 Chronic respiratory failure with hypoxia: Secondary | ICD-10-CM

## 2017-08-31 DIAGNOSIS — J679 Hypersensitivity pneumonitis due to unspecified organic dust: Secondary | ICD-10-CM

## 2017-08-31 LAB — PULMONARY FUNCTION TEST
DL/VA % pred: 93 %
DL/VA: 4.24 ml/min/mmHg/L
DLCO COR % PRED: 53 %
DLCO COR: 11.47 ml/min/mmHg
DLCO UNC % PRED: 56 %
DLCO unc: 12.08 ml/min/mmHg
FEF 25-75 POST: 1.41 L/s
FEF 25-75 PRE: 0.58 L/s
FEF2575-%CHANGE-POST: 143 %
FEF2575-%PRED-PRE: 55 %
FEF2575-%Pred-Post: 134 %
FEV1-%Change-Post: 22 %
FEV1-%Pred-Post: 63 %
FEV1-%Pred-Pre: 51 %
FEV1-POST: 1 L
FEV1-Pre: 0.82 L
FEV1FVC-%CHANGE-POST: 11 %
FEV1FVC-%PRED-PRE: 102 %
FEV6-%Change-Post: 10 %
FEV6-%Pred-Post: 59 %
FEV6-%Pred-Pre: 53 %
FEV6-Post: 1.2 L
FEV6-Pre: 1.09 L
FEV6FVC-%Change-Post: 0 %
FEV6FVC-%PRED-POST: 106 %
FEV6FVC-%Pred-Pre: 105 %
FVC-%Change-Post: 9 %
FVC-%PRED-POST: 55 %
FVC-%PRED-PRE: 50 %
FVC-POST: 1.2 L
FVC-PRE: 1.09 L
PRE FEV1/FVC RATIO: 75 %
Post FEV1/FVC ratio: 83 %
Post FEV6/FVC ratio: 100 %
Pre FEV6/FVC Ratio: 99 %
RV % pred: 101 %
RV: 2.43 L
TLC % PRED: 75 %
TLC: 3.57 L

## 2017-08-31 LAB — SEDIMENTATION RATE: Sed Rate: 24 mm/hr (ref 0–30)

## 2017-08-31 NOTE — Patient Instructions (Addendum)
Order- labs-  D-dimer, ANA, Sed rate, ANCA,  a1AT assay,     Dx   Chronic respiratory failure with hypoxia, COPD mixed type, Interstitial lung disease  Flu vax senior  I recommend you not use mentholatum in your nostrils- use otc nasal saline gel   We will continue oxygen 2-3 liters   Please call as needed

## 2017-08-31 NOTE — Progress Notes (Signed)
HPI 83 year old female never smoker (second hand smoke when husband was alive) with history of progressive dyspnea 3 years, interstitial groundglass changes. Had had birds and cat in home previously. Birds are gone now.. Labs 06/2016-negative for Aspergillus antigen, cryptococcal antigen, Sjogren's, scleroderma, anti-DNA, ANCA, She had her husband had lived for many years in a conspicuously moldy home according to daughter, but has moved to a different home. PFT 08/15/16-moderate restriction  with no response to dilator, mild reduction diffusion capacity. FVC 1.22/54%, FEV1 1.06/65%,FEF25-75%  ratio 0.87, TLC 76%, DLCO 63% Home sleep test 08/27/16-AHI 18.3/hour, desaturation to 56% with average saturation 88%, body weight 243 pounds. She could not get comfortable with CPAP and did not continue. CT chest 05/14/16-Interstitial lung disease characterized by patchy reticulation and ground-glass attenuation with mild architectural distortion throughout both lungs hypersensitivity pneumonitis, with the differential including nonspecific interstitial pneumonia (NSIP). These findings are not consistent with usual interstitial pneumonia (UIP PFT 08/31/17-severe obstructive airways disease with response to bronchodilator, moderate restriction, moderate reduction of diffusion.  FVC 1.20/55%, FEV1 1.00/63%, ratio 0.83, TLC 75%, DLCO 56%  Hypersensitivity Pneumonia Panel-07/24/17-negative/WNL Respiratory Allergy Profile 07/24/17- total IgE 280, no specific elevations D-dimer 07/24/17-2.29 H CBC 07/24/17-WBC 12,100, eosinophils 0.9 H ACE level 07/24/17-  < 5  --------------------------------------------------------------------------------------------------------  07/24/17-83 year old female never smoker (second hand smoke when husband was alive) with history of progressive dyspnea 3 years, interstitial groundglass changes. Had had birds and cat in home previously. Birds are gone now. Had sleep study last year for  hypersomnia and snoring. Labs 06/2016-negative for Aspergillus antigen, cryptococcal antigen, Sjogren's, scleroderma, anti-DNA, ANCA, She had her husband had lived for many years in a conspicuously moldy home according to daughter, but has moved to a different home. . Elevated-42-rheumatoid factor. Medical problem list includes COPD, GERD, anemia, interstitial lung disease, CKD III Lacrimal gland biopsy had shown lymphoid hyperplasia Home sleep test 08/27/16-AHI 18.3/hour, desaturation to 56% with average saturation 88%, body weight 243 pounds. She could not get comfortable with CPAP and did not continue. PFT 08/15/16-moderate restriction  with no response to dilator, mild reduction diffusion capacity. FVC 1.22/54%, FEV1 1.06/65%,FEF25-75%  ratio 0.87, TLC 76%, DLCO 63%. Pt has been taking prescribed nebulizer meds by Dr. Corrie Dandy which helped temporarily but pt is still having issues.. Pt was on a CPAP machine but is not able to handle the machine due to her breathing. Pt is becoming very SOB easily, even with her going to the bathroom makes her very SOB. Pt also complains of a dry to prod. cough with clear mucus. DME: [facility]. She is a widow of former patient of mine, here with her daughter. Complex history which I reviewed with her, including review of recent image studies and labs, and note from Dr. Murlean Iba. She has been more aware of dyspnea on exertion over the last several months, possibly with fairly abrupt onset but without chest pain or palpitation, fever or sweats.. Chronic leg edema has been worse in the past year. DVT after right total knee replacement 3 or 4 years ago treated with "shots". Occasional cough has been new but minimal. Intermittent episodes of gagging/retching sensation, not clear if this is airway or reflux related, but usually nonproductive. Right breast cancer with lumpectomy and XRT 20 years ago-no recurrence. Bilateral cervical adenopathy scheduled responded to antibiotics. CT  chest 05/14/16- IMPRESSION: 1. Significantly motion degraded study. 2. Interstitial lung disease characterized by patchy reticulation and ground-glass attenuation with mild architectural distortion throughout both lungs, predominantly involving the peribronchovascular lungs with lesser involvement  of the subpleural lungs. Prominent air trapping. No significant traction bronchiectasis. No frank honeycombing. No appreciable craniocaudal gradient. These findings are most suggestive of chronic hypersensitivity pneumonitis, with the differential including nonspecific interstitial pneumonia (NSIP). These findings are not consistent with usual interstitial pneumonia (UIP). A follow-up high-resolution chest CT study is recommended in 12 months to assess temporal pattern stability. 3. Mild paratracheal mediastinal lymphadenopathy, nonspecific, probably reactive. 4. Additional findings include coronary atherosclerosis, hypodense 1.3 cm left thyroid lobe nodule, mild diffuse hepatic steatosis and Cholelithiasis.  08/31/17-  83 year old female never smoker (second hand smoke when husband was alive) followed for history of progressive dyspnea 3 years, interstitial groundglass changes/ NSIP. Had had birds and cat in home previously. Birds are gone now., OSA, complicated by CAD/aortic atherosclerosis O2 3L L [facility] FOLLOWS FOR: DME: [facility] for O2. Pt continues to wear O2 and had PFT today.  CT pattern had favored hypersensitivity pneumonia or NSIP.   Probably trivial, but I recommended she not use Mentholatum/mineral oil in her nose.  Do not want any component of lipoid pneumonia. PFT 08/31/17-severe obstructive airways disease with response to bronchodilator, moderate restriction, moderate reduction of diffusion.  FVC 1.20/55%, FEV1 1.00/63%, ratio 0.83, TLC 75%, DLCO 56%  CTa chest 07/28/17 IMPRESSION: Stable chronic interstitial lung changes. No evidence of pulmonary embolus. Coronary artery  disease. Cholelithiasis. Aortic Atherosclerosis (ICD10-I70.0). Hypersensitivity Pneumonia Panel-07/24/17-negative/WNL Respiratory Allergy Profile 07/24/17- total IgE 280, no specific elevations D-dimer 07/24/17-2.29 H CBC 07/24/17-WBC 12,100, eosinophils 0.9 H ACE level 07/24/17-  < 5    ROS-see HPI   + = Positive Constitutional:    weight loss, night sweats, fevers, chills, fatigue, lassitude. HEENT:    headaches, difficulty swallowing, tooth/dental problems, sore throat,       sneezing, itching, ear ache, nasal congestion, post nasal drip, snoring CV:    chest pain, orthopnea, PND, + swelling in lower extremities, anasarca,                                                 dizziness, palpitations Resp:   +shortness of breath with exertion or at rest.                productive cough,   non-productive cough, coughing up of blood.              change in color of mucus.  wheezing.   Skin:    rash or lesions. GI:  No-   heartburn, indigestion, abdominal pain, nausea, vomiting, diarrhea,                 change in bowel habits, loss of appetite GU: dysuria, change in color of urine, no urgency or frequency.   flank pain. MS:   joint pain, stiffness, decreased range of motion, back pain. Neuro-     nothing unusual Psych:  change in mood or affect.  depression or anxiety.   memory loss.  OBJ- Physical Exam  + morbidly obese General- Alert, Oriented, Affect-appropriate, Distress- none acute, + morbidly obese. Room air saturation on arrival 88% at rest. Corrected to 93% on O2 2 L Skin- rash-none, lesions- none, excoriation- none Lymphadenopathy- none Head- atraumatic            Eyes- Gross vision intact, PERRLA, conjunctivae and secretions clear            Ears- Hearing, canals-normal  Nose- Clear, no-Septal dev, mucus, polyps, erosion, perforation             Throat- Mallampati IV , mucosa clear , drainage- none, tonsils- atrophic Neck- flexible , trachea midline, no stridor , thyroid  nl, carotid no bruit Chest - symmetrical excursion , unlabored           Heart/CV- RRR , no murmur , no gallop  , no rub, nl s1 s2                           - JVD- none , edema 3+ with water blisters, stasis changes + pink bilaterally, varices- none           Lung- clear to P&A, wheeze- none, cough- none , dullness-none, rub- none           Chest wall-  Abd-  Br/ Gen/ Rectal- Not done, not indicated Extrem- cyanosis- none, clubbing, none, atrophy- none, strength- nl, Homan's NEG Neuro- grossly intact to observation

## 2017-08-31 NOTE — Progress Notes (Signed)
PFT completed today 08/31/17  

## 2017-09-03 ENCOUNTER — Other Ambulatory Visit: Payer: Self-pay | Admitting: *Deleted

## 2017-09-03 DIAGNOSIS — R7989 Other specified abnormal findings of blood chemistry: Secondary | ICD-10-CM

## 2017-09-03 DIAGNOSIS — R0989 Other specified symptoms and signs involving the circulatory and respiratory systems: Secondary | ICD-10-CM

## 2017-09-03 LAB — D-DIMER, QUANTITATIVE: D-Dimer, Quant: 1.52 mcg/mL FEU — ABNORMAL HIGH (ref <0.50)

## 2017-09-03 LAB — ALPHA-1 ANTITRYPSIN PHENOTYPE: A1 ANTITRYPSIN SER: 156 mg/dL (ref 83–199)

## 2017-09-03 LAB — ANA: Anti Nuclear Antibody(ANA): NEGATIVE

## 2017-09-04 ENCOUNTER — Telehealth: Payer: Self-pay | Admitting: Internal Medicine

## 2017-09-04 ENCOUNTER — Telehealth: Payer: Self-pay

## 2017-09-04 ENCOUNTER — Ambulatory Visit (HOSPITAL_COMMUNITY)
Admission: RE | Admit: 2017-09-04 | Discharge: 2017-09-04 | Disposition: A | Discharge status: Home / Self Care | Payer: Medicare Other | Source: Ambulatory Visit | Attending: Internal Medicine | Admitting: Internal Medicine

## 2017-09-04 DIAGNOSIS — I824Y3 Acute embolism and thrombosis of unspecified deep veins of proximal lower extremity, bilateral: Secondary | ICD-10-CM

## 2017-09-04 DIAGNOSIS — Z86718 Personal history of other venous thrombosis and embolism: Secondary | ICD-10-CM | POA: Insufficient documentation

## 2017-09-04 DIAGNOSIS — Z09 Encounter for follow-up examination after completed treatment for conditions other than malignant neoplasm: Secondary | ICD-10-CM | POA: Insufficient documentation

## 2017-09-04 NOTE — Progress Notes (Signed)
**  Preliminary report by tech**  Bilateral lower extremity venous duplex completed. There is no obvious evidence of deep or superficial vein thrombosis involving the right and left lower extremities. All clearly visualized vessels appear patent and compressible. There is no evidence of Baker's cysts bilaterally. Results were given to [facility] at Dr. Janee Morn office.  09/04/17 2:30 PM Shelia Gardner RVT

## 2017-09-04 NOTE — Telephone Encounter (Signed)
Spoke with Marya Amsler with radiology, who states pt's venous doppler was negative for DVT.   Will route to CY to make aware.

## 2017-09-04 NOTE — Telephone Encounter (Signed)
Today at 1:45 at [facility] daughter is aware

## 2017-09-04 NOTE — Telephone Encounter (Signed)
PCC's  The order for this was placed yesterday for the doppler study---this needs to be done ASAP  Please.  Thanks

## 2017-10-22 DIAGNOSIS — J9611 Chronic respiratory failure with hypoxia: Secondary | ICD-10-CM | POA: Diagnosis not present

## 2017-10-22 DIAGNOSIS — Z1331 Encounter for screening for depression: Secondary | ICD-10-CM | POA: Diagnosis not present

## 2017-10-22 DIAGNOSIS — I872 Venous insufficiency (chronic) (peripheral): Secondary | ICD-10-CM | POA: Diagnosis not present

## 2017-10-22 DIAGNOSIS — Z23 Encounter for immunization: Secondary | ICD-10-CM | POA: Diagnosis not present

## 2017-10-22 DIAGNOSIS — I129 Hypertensive chronic kidney disease with stage 1 through stage 4 chronic kidney disease, or unspecified chronic kidney disease: Secondary | ICD-10-CM | POA: Diagnosis not present

## 2017-10-22 DIAGNOSIS — Z79899 Other long term (current) drug therapy: Secondary | ICD-10-CM | POA: Diagnosis not present

## 2017-10-22 DIAGNOSIS — J309 Allergic rhinitis, unspecified: Secondary | ICD-10-CM | POA: Diagnosis not present

## 2017-10-22 DIAGNOSIS — N183 Chronic kidney disease, stage 3 (moderate): Secondary | ICD-10-CM | POA: Diagnosis not present

## 2017-10-22 DIAGNOSIS — J449 Chronic obstructive pulmonary disease, unspecified: Secondary | ICD-10-CM | POA: Diagnosis not present

## 2017-11-03 ENCOUNTER — Telehealth: Payer: Self-pay | Admitting: Internal Medicine

## 2017-11-03 MED ORDER — BUDESONIDE 0.5 MG/2ML IN SUSP: 0.5000 mg | Freq: Two times a day (BID) | RESPIRATORY_TRACT | 11 refills | Status: AC

## 2017-11-03 MED ORDER — BUDESONIDE 0.5 MG/2ML IN SUSP: 0.5000 mg | Freq: Two times a day (BID) | RESPIRATORY_TRACT | 11 refills | Status: DC

## 2017-11-03 NOTE — Telephone Encounter (Signed)
[facility] and nothing further is needed.

## 2017-11-06 IMAGING — CT CT CHEST HIGH RESOLUTION W/O CM
3 of 8 series · 16 of 30 positions shown, 18 images · non-contrast
Comparison: 05/07/2016 chest radiograph. No prior chest CT.
03/05/2015 CT abdomen/pelvis.

CLINICAL DATA: Interstitial opacities on recent chest radiograph.
Wheezing and shortness of breath. Cough. Remote history of right
breast cancer treated with lumpectomy and radiation therapy.
Nonsmoker.

EXAM:
CT CHEST WITHOUT CONTRAST
TECHNIQUE: Multidetector CT imaging of the chest was performed following the
standard protocol without intravenous contrast. High resolution
imaging of the lungs, as well as inspiratory and expiratory imaging,
was performed.

[Series 3: chest w/o 5mm · axial · non-contrast · 0.64mm/px · z∈[-239,-29]mm · 5 of 127 slices shown]
[im 22/127  lung]
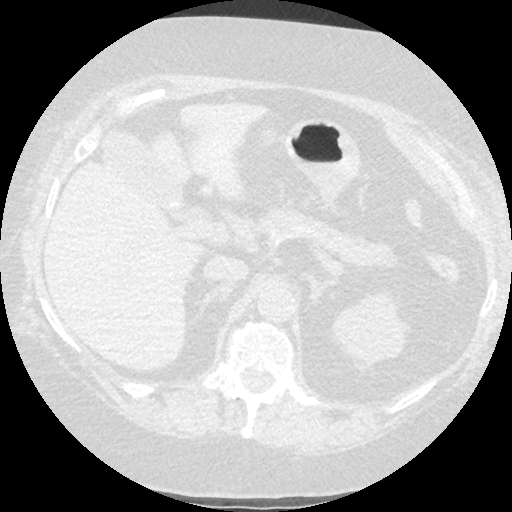
[im 43/127  lung]
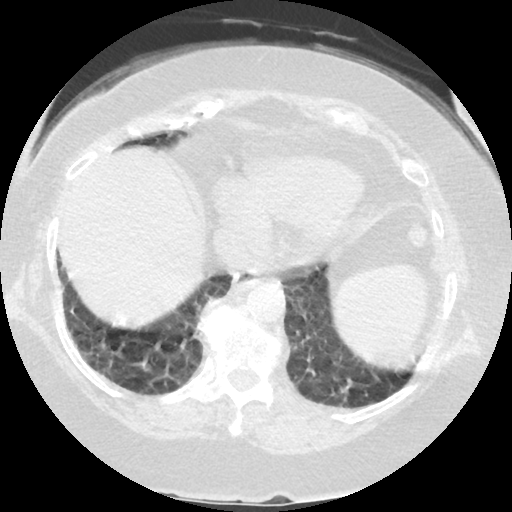
[im 64/127  lung]
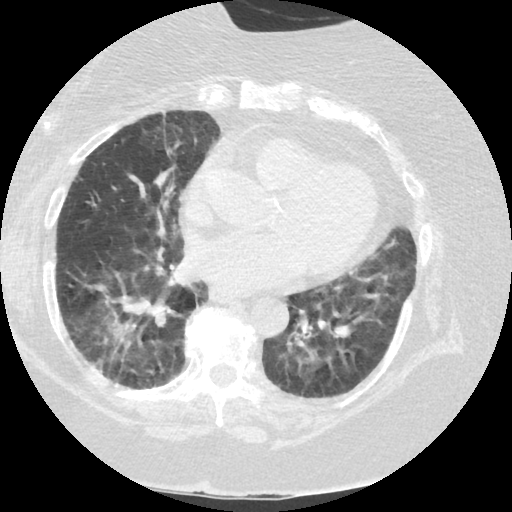
[im 85/127  lung]
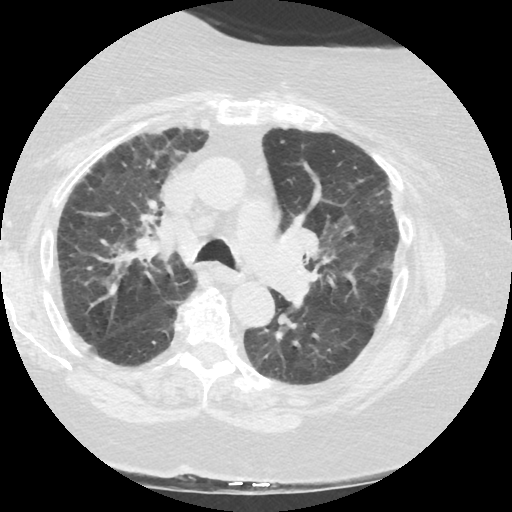
[im 106/127  lung]
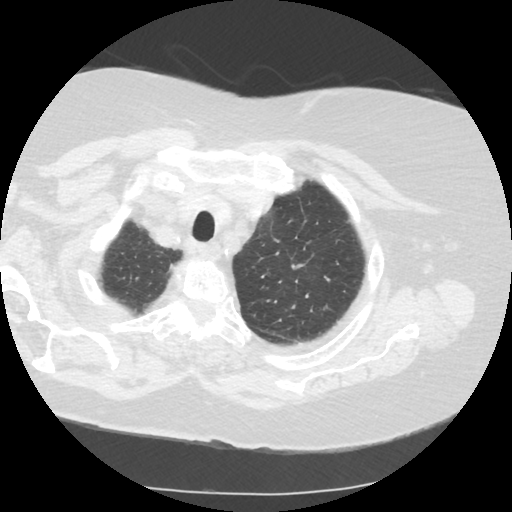

[Series 4: lung windows · axial · 0.64mm/px · z∈[-247,-22]mm · 6 of 127 slices shown, 8 images]
[im 19/127  mediastinal]
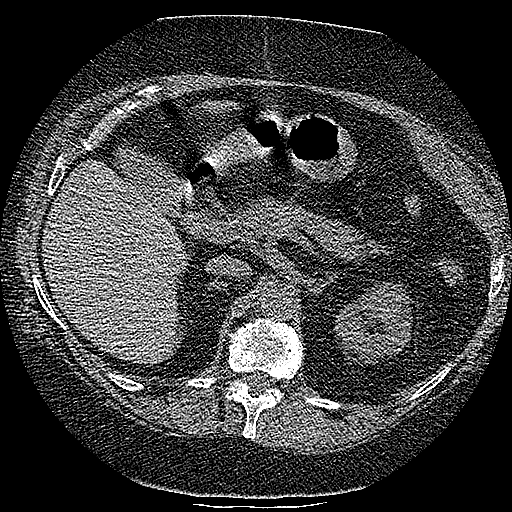
[im 19/127  lung]
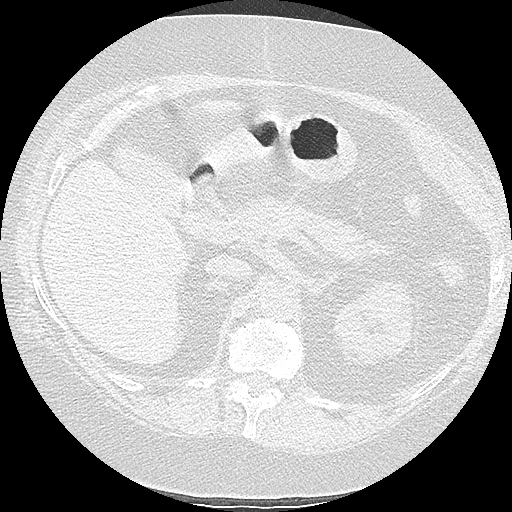
[im 37/127  lung]
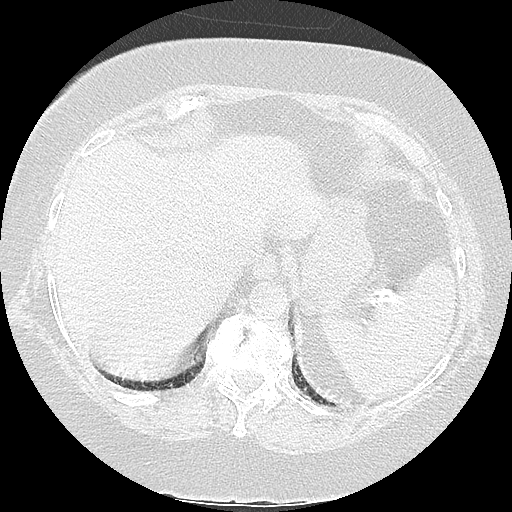
[im 55/127  lung]
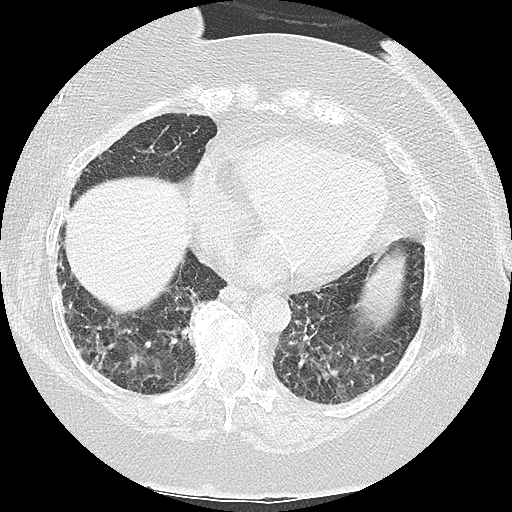
[im 73/127  lung]
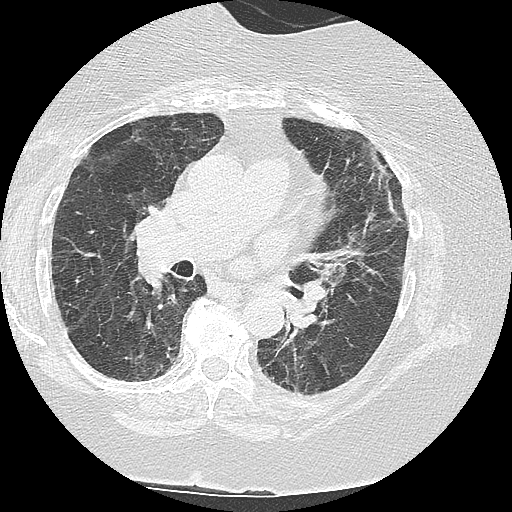
[im 91/127  mediastinal]
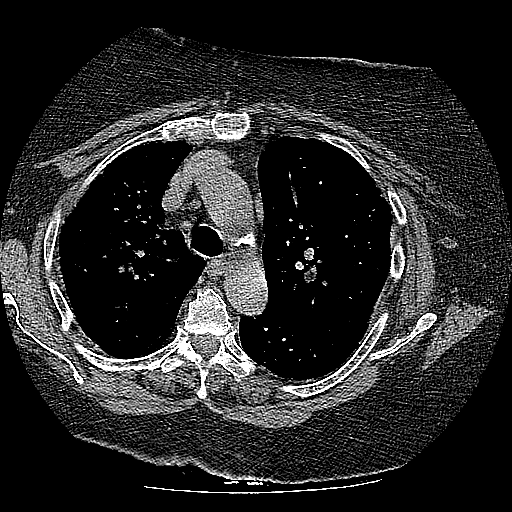
[im 91/127  lung]
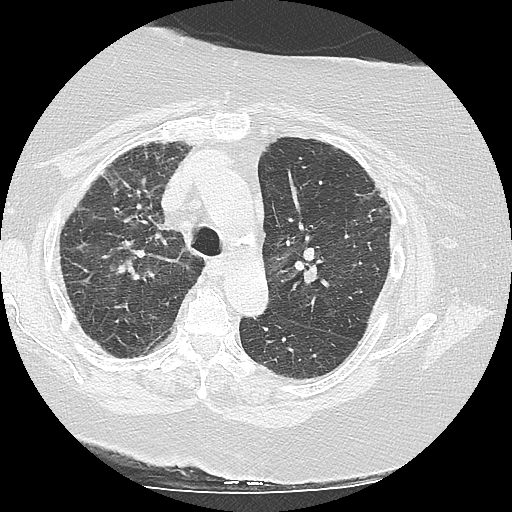
[im 109/127  lung]
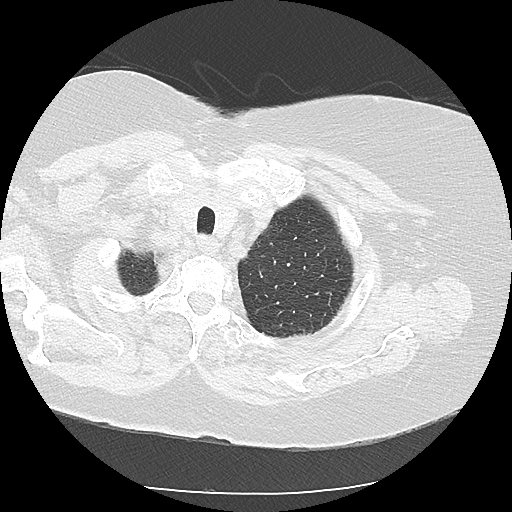

[Series 602: sagittal body · sagittal · 0.64mm/px · 5 of 133 slices shown]
[im 19/133  mediastinal]
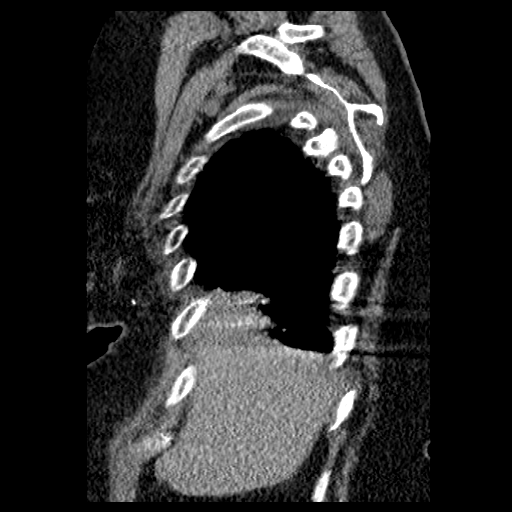
[im 38/133  mediastinal]
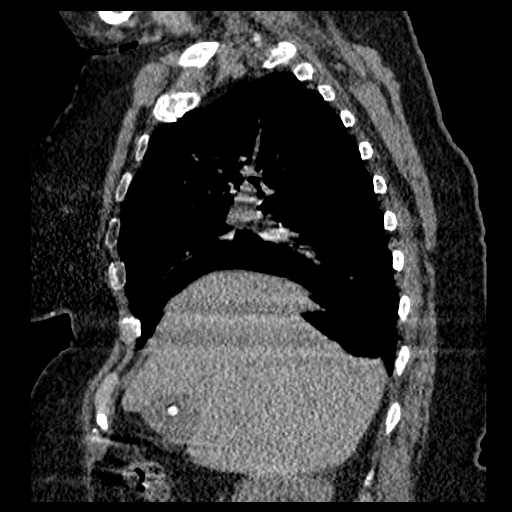
[im 57/133  mediastinal]
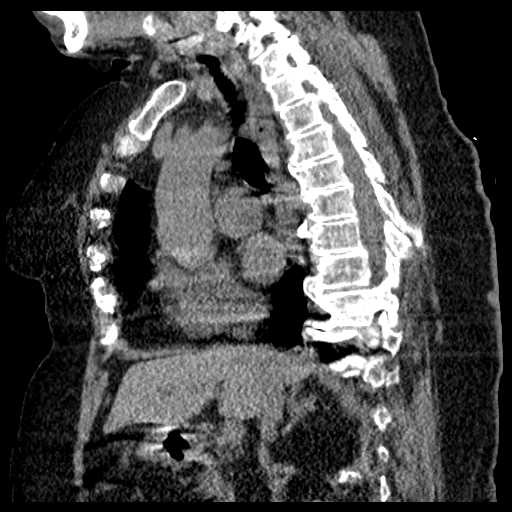
[im 76/133  mediastinal]
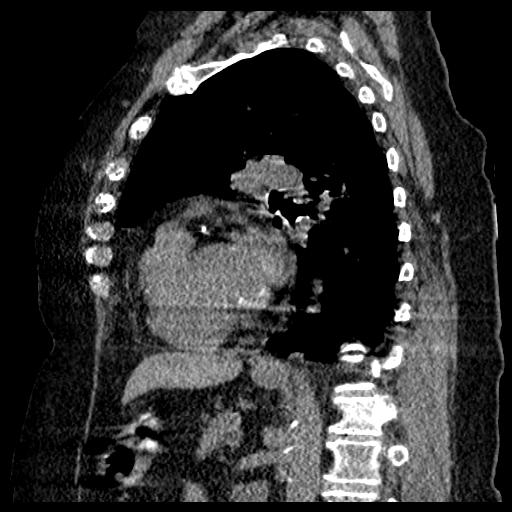
[im 95/133  mediastinal]
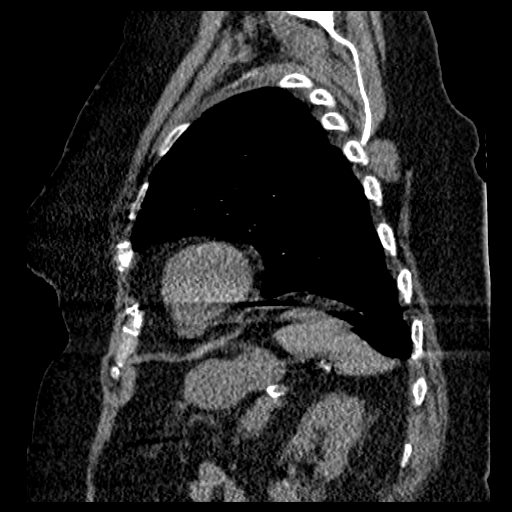

[16 of 30 positions shown; findings below may reference images not displayed]

FINDINGS: Significantly motion degraded study.

Mediastinum/Nodes: Normal heart size. No pericardial
fluid/thickening. Left anterior descending, left circumflex and
right coronary atherosclerosis. Mildly atherosclerotic nonaneurysmal
thoracic aorta. Pulmonary arteries are within normal limits.
Suggestion of a hypodense 1.3 cm left thyroid lobe nodule. Normal
esophagus. No axillary adenopathy. Mild right paratracheal
adenopathy measuring up to 1.4 cm (series 3/ image 45). No
additional pathologically enlarged mediastinal or gross hilar nodes
on this noncontrast study.

Lungs/Pleura: No pneumothorax. No pleural effusion. No acute
consolidative airspace disease, significant pulmonary nodules or
lung masses. There is patchy ground-glass attenuation and
reticulation throughout both lungs predominantly involving the
peribronchovascular lungs, with lesser involvement of the subpleural
lungs, with associated mild architectural distortion. No significant
regions of traction bronchiectasis. No frank honeycombing. No
appreciable craniocaudal gradient. There is significant air trapping
throughout both lungs on the expiration sequence.

Upper abdomen: Mild diffuse hepatic steatosis.  Cholelithiasis.

Musculoskeletal: No aggressive appearing focal osseous lesions.
Marked degenerative changes in the thoracic spine. Partially
visualized surgical clips in the right posterior breast.
IMPRESSION: 1. Significantly motion degraded study.
2. Interstitial lung disease characterized by patchy reticulation
and ground-glass attenuation with mild architectural distortion
throughout both lungs, predominantly involving the
peribronchovascular lungs with lesser involvement of the subpleural
lungs. Prominent air trapping. No significant traction
bronchiectasis. No frank honeycombing. No appreciable craniocaudal
gradient. These findings are most suggestive of chronic
hypersensitivity pneumonitis, with the differential including
nonspecific interstitial pneumonia (NSIP). These findings are not
consistent with usual interstitial pneumonia (UIP). A follow-up
high-resolution chest CT study is recommended in 12 months to assess
temporal pattern stability.
3. Mild paratracheal mediastinal lymphadenopathy, nonspecific,
probably reactive.
4. Additional findings include coronary atherosclerosis, hypodense
1.3 cm left thyroid lobe nodule, mild diffuse hepatic steatosis and
cholelithiasis.

## 2017-11-09 ENCOUNTER — Ambulatory Visit: Payer: Medicare Other | Admitting: Internal Medicine

## 2017-11-13 ENCOUNTER — Telehealth: Payer: Self-pay | Admitting: *Deleted

## 2017-11-13 NOTE — Assessment & Plan Note (Signed)
She remains oxygen dependent and I doubt this is going to change.

## 2017-11-13 NOTE — Telephone Encounter (Addendum)
PA-initiated for Pulmicort PA Case ID: [id number] - Rx #: [id number] Pending decision

## 2017-11-13 NOTE — Assessment & Plan Note (Addendum)
Obstructive disease and restrictive disease are probably part of the same process although she certainly has obesity hypoventilation.  The interstitial lung disease could cause some obstruction due to airway of distortion.  She also had passive smoke exposure. Plan-flu vaccine, will continue nebulizer DuoNeb/budesonide as directed

## 2017-11-13 NOTE — Assessment & Plan Note (Addendum)
Lack of progression of interstitial disease over the past year is hopeful, making an active progressive disease process less likely. Plan-connective tissue/inflammatory serologies can be tracked as needed

## 2017-11-18 NOTE — Telephone Encounter (Signed)
PA denied for Pulmicort thru Medicare Part D. Called [facility] [phone] to notify and ask to file thru Part B. Spoke to [place] and gave patient's medicare Part B ID #. She was having a hard time getting this to process but stated she would give the office a call back if she needed anything else.

## 2017-11-25 DIAGNOSIS — H0100B Unspecified blepharitis left eye, upper and lower eyelids: Secondary | ICD-10-CM | POA: Diagnosis not present

## 2017-11-25 DIAGNOSIS — H0100A Unspecified blepharitis right eye, upper and lower eyelids: Secondary | ICD-10-CM | POA: Diagnosis not present

## 2017-12-15 ENCOUNTER — Telehealth: Payer: Self-pay | Admitting: Internal Medicine

## 2017-12-15 NOTE — Telephone Encounter (Signed)
If symptoms are new in last few days, it is mostly likely viral. Oxygen can overdry, and that can be helped by having the DME company add a humidifier to the home O2 concentrator if she doesn't have one. This is not the time of year for much in the way of nasal allergy symptoms.

## 2017-12-15 NOTE — Telephone Encounter (Signed)
Spoke with pt's daughter, she states she has a runny nose, sneezing, nasal passage are stopped up as if they we swollen. She has a question about whether the oxygen has anything to do with her symptoms. She is also having a cough with clear phlegm and discolored at some points. She doesn't know if this is allergies, she changed from Claritin to [place] and Dr. Felipa Eth added Singular to her medications. Is there any other things she can do for this?   [facility] [place]  Current Outpatient Medications on File Prior to Visit  Medication Sig Dispense Refill  . aspirin 81 MG tablet Take 81 mg by mouth daily.    . budesonide (PULMICORT) 0.5 MG/2ML nebulizer solution Take 2 mLs (0.5 mg total) by nebulization 2 (two) times daily. 120 mL 11  . ipratropium-albuterol (DUONEB) 0.5-2.5 (3) MG/3ML SOLN Take 3 mLs by nebulization 4 (four) times daily. 360 mL 11  . lisinopril (PRINIVIL,ZESTRIL) 10 MG tablet Take 5 mg by mouth every morning. TAKES 1/2 TABLET    . loratadine (CLARITIN) 10 MG tablet Take 10 mg by mouth daily.    . Multiple Vitamin (MULTIVITAMIN) tablet Take 1 tablet by mouth daily.    . Multiple Vitamins-Minerals (OCUVITE PO) Take 1 tablet by mouth daily.    . Omega-3 Fatty Acids (FISH OIL) 1000 MG CAPS Take 1 capsule by mouth daily.    Marland Kitchen saccharomyces boulardii (FLORASTOR) 250 MG capsule Take 250 mg by mouth daily.     No current facility-administered medications on file prior to visit.    Allergies  Allergen Reactions  . Pantoprazole     Pt feels it has been causing some diarrhea  . Spiriva [Tiotropium Bromide Monohydrate]     Pt feels that this causes constipation  . Tramadol Nausea Only

## 2017-12-15 NOTE — Telephone Encounter (Signed)
See if daughter can come by and pick up a sample of Dymista nasal spray to try- 1 or 2 puffs each nostril, once or twice daily.  They can talk with O2 DME about trying a larger nasal cannula if available

## 2017-12-15 NOTE — Telephone Encounter (Signed)
Spoke with pt's daughter who stated that pt's symptoms have been happening x6 weeks.   Pt already has a humidifier with her O2.  Pt's daughter states that she has been googling her mother's symptoms and based off of what she had been googling, with her mother getting worse that that made her call.  Along with googling her mother's symptoms, pt's daughter said she read where a larger nasal cannula might help. Pt is constantly blowing her nose and going through a box of kleenex quickly and taking Allegra and Singulair.

## 2017-12-16 NOTE — Telephone Encounter (Signed)
Attempted to contact pt's daughter. Received fast busy signal x2. Will try back.

## 2017-12-17 MED ORDER — AZELASTINE-FLUTICASONE 137-50 MCG/ACT NA SUSP: 1.0000 | Freq: Two times a day (BID) | NASAL | 0 refills | Status: DC

## 2017-12-17 NOTE — Telephone Encounter (Signed)
Spoke with patient's daughter. She is aware of CY's recs. Will place the Dymista up front for pickup. Nothing else needed at time of call.

## 2017-12-22 ENCOUNTER — Telehealth: Payer: Self-pay | Admitting: Internal Medicine

## 2017-12-22 NOTE — Telephone Encounter (Signed)
Tried to leave message for patient, no voicemail is available at this time the box is full.  Pt calling regarding Dimista sample is working well and would like a Rx called in. Pharmacy [facility] [place] and [address]  Routing message to CY Please advise if RX for dimista can be sent into pt's pharmacy today  Current Outpatient Medications on File Prior to Visit  Medication Sig Dispense Refill  . aspirin 81 MG tablet Take 81 mg by mouth daily.    . Azelastine-Fluticasone (DYMISTA) 137-50 MCG/ACT SUSP Place 1-2 sprays into the nose 2 (two) times daily. 1 Bottle 0  . budesonide (PULMICORT) 0.5 MG/2ML nebulizer solution Take 2 mLs (0.5 mg total) by nebulization 2 (two) times daily. 120 mL 11  . ipratropium-albuterol (DUONEB) 0.5-2.5 (3) MG/3ML SOLN Take 3 mLs by nebulization 4 (four) times daily. 360 mL 11  . lisinopril (PRINIVIL,ZESTRIL) 10 MG tablet Take 5 mg by mouth every morning. TAKES 1/2 TABLET    . loratadine (CLARITIN) 10 MG tablet Take 10 mg by mouth daily.    . Multiple Vitamin (MULTIVITAMIN) tablet Take 1 tablet by mouth daily.    . Multiple Vitamins-Minerals (OCUVITE PO) Take 1 tablet by mouth daily.    . Omega-3 Fatty Acids (FISH OIL) 1000 MG CAPS Take 1 capsule by mouth daily.    Marland Kitchen saccharomyces boulardii (FLORASTOR) 250 MG capsule Take 250 mg by mouth daily.     No current facility-administered medications on file prior to visit.     Allergies  Allergen Reactions  . Pantoprazole     Pt feels it has been causing some diarrhea  . Spiriva [Tiotropium Bromide Monohydrate]     Pt feels that this causes constipation  . Tramadol Nausea Only

## 2017-12-23 MED ORDER — AZELASTINE-FLUTICASONE 137-50 MCG/ACT NA SUSP: 1.0000 | Freq: Two times a day (BID) | NASAL | 11 refills | Status: DC

## 2017-12-23 NOTE — Telephone Encounter (Signed)
Rx was sent and a detailed msg left for the pt to be made aware that this was done

## 2017-12-23 NOTE — Telephone Encounter (Signed)
Ok to send Rx Dymista nasal spray    # 1,  1-2 puffs each nostril once or twice daily     Ref x 12

## 2018-01-05 ENCOUNTER — Telehealth: Payer: Self-pay | Admitting: Internal Medicine

## 2018-01-05 MED ORDER — AZELASTINE HCL 0.1 % NA SOLN: 1.0000 | Freq: Two times a day (BID) | NASAL | 5 refills | Status: DC

## 2018-01-05 MED ORDER — FLUTICASONE PROPIONATE 50 MCG/ACT NA SUSP: 1.0000 | Freq: Every day | NASAL | 5 refills | Status: DC

## 2018-01-05 NOTE — Telephone Encounter (Signed)
Change Rx to Astelin nasal spray     # 1,    1-2 sprays each nostril at bedtime  Can combine this with otc Flonase nasal spray 1-2 sprays each nostril each morning  Chemically the combination is the same as Dymista, and much cheaper

## 2018-01-05 NOTE — Telephone Encounter (Signed)
[place] insurance will not pay for the Dymista and Baxter Flattery reports this is working well for her. Please advise, samples, change, or PA so insurance can cover? Can we give her a sample if we initiate PA?   Current Outpatient Medications on File Prior to Visit  Medication Sig Dispense Refill  . aspirin 81 MG tablet Take 81 mg by mouth daily.    . Azelastine-Fluticasone (DYMISTA) 137-50 MCG/ACT SUSP Place 1-2 sprays into the nose 2 (two) times daily. 1 Bottle 11  . budesonide (PULMICORT) 0.5 MG/2ML nebulizer solution Take 2 mLs (0.5 mg total) by nebulization 2 (two) times daily. 120 mL 11  . ipratropium-albuterol (DUONEB) 0.5-2.5 (3) MG/3ML SOLN Take 3 mLs by nebulization 4 (four) times daily. 360 mL 11  . lisinopril (PRINIVIL,ZESTRIL) 10 MG tablet Take 5 mg by mouth every morning. TAKES 1/2 TABLET    . loratadine (CLARITIN) 10 MG tablet Take 10 mg by mouth daily.    . Multiple Vitamin (MULTIVITAMIN) tablet Take 1 tablet by mouth daily.    . Multiple Vitamins-Minerals (OCUVITE PO) Take 1 tablet by mouth daily.    . Omega-3 Fatty Acids (FISH OIL) 1000 MG CAPS Take 1 capsule by mouth daily.    Marland Kitchen saccharomyces boulardii (FLORASTOR) 250 MG capsule Take 250 mg by mouth daily.     No current facility-administered medications on file prior to visit.    Allergies  Allergen Reactions  . Pantoprazole     Pt feels it has been causing some diarrhea  . Spiriva [Tiotropium Bromide Monohydrate]     Pt feels that this causes constipation  . Tramadol Nausea Only

## 2018-01-05 NOTE — Telephone Encounter (Signed)
Called Baxter Flattery  and advised message from the provider. She understood and verbalized understanding. Rx;s sent in to the pharmacy. Nothing further is needed.

## 2018-02-26 ENCOUNTER — Encounter: Payer: Self-pay | Admitting: Internal Medicine

## 2018-02-26 ENCOUNTER — Ambulatory Visit (INDEPENDENT_AMBULATORY_CARE_PROVIDER_SITE_OTHER): Payer: Medicare Other | Admitting: Internal Medicine

## 2018-02-26 VITALS — BP 126/74 | HR 95

## 2018-02-26 DIAGNOSIS — J0100 Acute maxillary sinusitis, unspecified: Secondary | ICD-10-CM

## 2018-02-26 DIAGNOSIS — J449 Chronic obstructive pulmonary disease, unspecified: Secondary | ICD-10-CM

## 2018-02-26 DIAGNOSIS — J9611 Chronic respiratory failure with hypoxia: Secondary | ICD-10-CM | POA: Diagnosis not present

## 2018-02-26 DIAGNOSIS — J849 Interstitial pulmonary disease, unspecified: Secondary | ICD-10-CM | POA: Diagnosis not present

## 2018-02-26 MED ORDER — ALBUTEROL SULFATE (2.5 MG/3ML) 0.083% IN NEBU: 2.5000 mg | INHALATION_SOLUTION | Freq: Four times a day (QID) | RESPIRATORY_TRACT | 12 refills | Status: AC | PRN

## 2018-02-26 MED ORDER — FLUTICASONE-UMECLIDIN-VILANT 100-62.5-25 MCG/INH IN AEPB: 1.0000 | INHALATION_SPRAY | Freq: Every day | RESPIRATORY_TRACT | 0 refills | Status: DC

## 2018-02-26 NOTE — Patient Instructions (Signed)
We will continue oxygen at 3L, (4 L with exertion if needed)  Script sent for amoxacillin   Sample Trelegy inhaler    Inhale 1 puff, then rinse mouth, once daily     See if this adds any help for your breathing.  You can continue or reduce your nebulizer treatments while using Trelegy, based on your own judgment.

## 2018-02-26 NOTE — Progress Notes (Signed)
HPI 84 year old female never smoker (second hand smoke when husband was alive) followed for COPD, chronic hypoxic respiratory failure, with history of progressive dyspnea 3 years, interstitial groundglass changes. Had had birds and cat in home previously. Birds are gone now.. Labs 06/2016-negative for Aspergillus antigen, cryptococcal antigen, Sjogren's, scleroderma, anti-DNA, ANCA, She had her husband had lived for many years in a conspicuously moldy home according to daughter, but has moved to a different home. PFT 08/15/16-moderate restriction  with no response to dilator, mild reduction diffusion capacity. FVC 1.22/54%, FEV1 1.06/65%,FEF25-75%  ratio 0.87, TLC 76%, DLCO 63% Home sleep test 08/27/16-AHI 18.3/hour, desaturation to 56% with average saturation 88%, body weight 243 pounds. She could not get comfortable with CPAP and did not continue. CT chest 05/14/16-Interstitial lung disease characterized by patchy reticulation and ground-glass attenuation with mild architectural distortion throughout both lungs hypersensitivity pneumonitis, with the differential including nonspecific interstitial pneumonia (NSIP). These findings are not consistent with usual interstitial pneumonia (UIP PFT 08/31/17-severe obstructive airways disease with response to bronchodilator, moderate restriction, moderate reduction of diffusion.  FVC 1.20/55%, FEV1 1.00/63%, ratio 0.83, TLC 75%, DLCO 56%  Hypersensitivity Pneumonia Panel-07/24/17-negative/WNL Respiratory Allergy Profile 07/24/17- total IgE 280, no specific elevations D-dimer 07/24/17-2.29 H CBC 07/24/17-WBC 12,100, eosinophils 0.9 H ACE level 07/24/17-  < 5  CT chest 07/28/17-hypersensitivity pneumonitis, with the differential including nonspecific interstitial pneumonia (NSIP). These findings are not consistent with usual interstitial pneumonia (UIP) -------------------------------------------------------------------------------------------------------- 08/31/17-   84 year old female never smoker (second hand smoke when husband was alive) followed for COPD, chronic hypoxic resp failure, history of progressive dyspnea 3 years, interstitial groundglass changes/ NSIP. Had had birds and cat in home previously. Birds are gone now., OSA, complicated by CAD/aortic atherosclerosis O2 3L L [facility] FOLLOWS FOR: DME: [facility] for O2. Pt continues to wear O2 and had PFT today.  CT pattern had favored hypersensitivity pneumonia or NSIP.   Probably trivial, but I recommended she not use Mentholatum/mineral oil in her nose.  Do not want any component of lipoid pneumonia. PFT 08/31/17-severe obstructive airways disease with response to bronchodilator, moderate restriction, moderate reduction of diffusion.  FVC 1.20/55%, FEV1 1.00/63%, ratio 0.83, TLC 75%, DLCO 56%  CTa chest 07/28/17 IMPRESSION: Stable chronic interstitial lung changes. No evidence of pulmonary embolus. Coronary artery disease. Cholelithiasis. Aortic Atherosclerosis (ICD10-I70.0). Hypersensitivity Pneumonia Panel-07/24/17-negative/WNL Respiratory Allergy Profile 07/24/17- total IgE 280, no specific elevations D-dimer 07/24/17-2.29 H CBC 07/24/17-WBC 12,100, eosinophils 0.9 H ACE level 07/24/17-  < 5   02/26/18- 84 year old female never smoker (second hand smoke when husband was alive) followed for history of progressive dyspnea 3 years, interstitial groundglass changes/ NSIP. Had had birds and cat in home previously. Birds are gone now., OSA, complicated by CAD/aortic atherosclerosis O2 3L/ [facility] ----COPD mixed type: Pt continues to use O2. Pt is having SOB, wheezing, cough, congestion, fatiuged as well-daughter states patient is doing better since coming here but continues to have concerns with breathing. Neb DuoNeb/Pulmicort They question sinus infection-green nasal discharge, malaise, some headache.  No fever. Very much like Dymista nasal spray, too expensive.  Now using Astelin. Nebulizer treatments  with DuoNeb/Pulmicort 2-4 times daily, working well. Dr. Felipa Eth started Singulair.  ROS-see HPI   + = Positive Constitutional:    weight loss, night sweats, fevers, chills, fatigue, lassitude. HEENT:    headaches, difficulty swallowing, tooth/dental problems, sore throat,       sneezing, itching, ear ache, nasal congestion, +post nasal drip, snoring CV:    chest pain, orthopnea, PND, + swelling in lower extremities,  anasarca,                                                 dizziness, palpitations Resp:   +shortness of breath with exertion or at rest.                productive cough,   non-productive cough, coughing up of blood.              change in color of mucus.  wheezing.   Skin:    rash or lesions. GI:  No-   heartburn, indigestion, abdominal pain, nausea, vomiting, diarrhea,                 change in bowel habits, loss of appetite GU: dysuria, change in color of urine, no urgency or frequency.   flank pain. MS:   joint pain, stiffness, decreased range of motion, back pain. Neuro-     nothing unusual Psych:  change in mood or affect.  depression or anxiety.   memory loss.  OBJ- Physical Exam  + morbidly obese, + wheelchair, + O2 3 L General- Alert, Oriented, Affect-appropriate, Distress- none acute,  Skin- rash-none, lesions- none, excoriation- none Lymphadenopathy- none Head- atraumatic            Eyes- Gross vision intact, PERRLA, conjunctivae and secretions clear            Ears- Hearing, canals-normal            Nose- Clear, no-Septal dev, mucus, polyps, erosion, perforation             Throat- Mallampati IV , mucosa clear , drainage- none, tonsils- atrophic Neck- flexible , trachea midline, no stridor , thyroid nl, carotid no bruit Chest - symmetrical excursion , unlabored           Heart/CV- RRR , no murmur , no gallop  , no rub, nl s1 s2                           - JVD- none , edema 1+ , stasis changes + pink bilaterally, varices- none           Lung-  wheeze+ trace,  cough- none , dullness-none, rub- none           Chest wall-  Abd-  Br/ Gen/ Rectal- Not done, not indicated Extrem- cyanosis- none, clubbing, none, atrophy- none, strength- nl, Homan's NEG Neuro- grossly intact to observation

## 2018-02-28 DIAGNOSIS — J0100 Acute maxillary sinusitis, unspecified: Secondary | ICD-10-CM | POA: Insufficient documentation

## 2018-02-28 NOTE — Assessment & Plan Note (Signed)
She does best with 3 L.  Could go to 4 L during exertion if needed, but avoid CO2 retention.

## 2018-02-28 NOTE — Assessment & Plan Note (Signed)
She gets some benefit from nebulizer treatments reflecting a reactive component. Plan-try Trelegy

## 2018-02-28 NOTE — Assessment & Plan Note (Signed)
She may be developing sinusitis by description.  With weekend here, we will cover. Plan-amoxicillin

## 2018-02-28 NOTE — Assessment & Plan Note (Signed)
Not clear how much  is active inflammation, old scarring, interstitial edema.  Symptomatically she is doing better.  We will give a little more time then update high resolution chest CT for comparison.

## 2018-03-01 ENCOUNTER — Telehealth: Payer: Self-pay | Admitting: Internal Medicine

## 2018-03-01 MED ORDER — AMOXICILLIN 500 MG PO TABS: 500.0000 mg | ORAL_TABLET | Freq: Two times a day (BID) | ORAL | 0 refills | Status: DC

## 2018-03-01 NOTE — Telephone Encounter (Signed)
Spoke with pt's daughter, Baxter Flattery. Pt was to have a prescription for Amoxicillin per her last AVS instructions. This didn't get sent in.  CY - please clarify on the Amoxicillin prescription. Thanks!

## 2018-03-01 NOTE — Telephone Encounter (Signed)
Spoke with pt's daughter. She is aware that this prescription will be sent in. Rx has been sent in. Nothing further was needed at this time.

## 2018-03-01 NOTE — Telephone Encounter (Signed)
Sorry that got missed  Amoxacillin 500 mg, # 14, 1 twice daily

## 2018-03-01 NOTE — Addendum Note (Signed)
Addended by: Desmond Dike C on: 03/01/2018 04:15 PM   Modules accepted: Orders

## 2018-03-19 DIAGNOSIS — I872 Venous insufficiency (chronic) (peripheral): Secondary | ICD-10-CM | POA: Diagnosis not present

## 2018-03-19 DIAGNOSIS — I129 Hypertensive chronic kidney disease with stage 1 through stage 4 chronic kidney disease, or unspecified chronic kidney disease: Secondary | ICD-10-CM | POA: Diagnosis not present

## 2018-03-19 DIAGNOSIS — Z853 Personal history of malignant neoplasm of breast: Secondary | ICD-10-CM | POA: Diagnosis not present

## 2018-03-19 DIAGNOSIS — S81812A Laceration without foreign body, left lower leg, initial encounter: Secondary | ICD-10-CM | POA: Diagnosis not present

## 2018-03-19 DIAGNOSIS — K119 Disease of salivary gland, unspecified: Secondary | ICD-10-CM | POA: Diagnosis not present

## 2018-03-19 DIAGNOSIS — N183 Chronic kidney disease, stage 3 (moderate): Secondary | ICD-10-CM | POA: Diagnosis not present

## 2018-04-09 ENCOUNTER — Telehealth: Payer: Self-pay | Admitting: Internal Medicine

## 2018-04-09 NOTE — Telephone Encounter (Signed)
Spoke with Shelia Gardner and advised her she can use Trelegy with the nebulizer medications. The trelegy was added to help her breathing per CY note. Nothing further is needed.   Current Outpatient Medications on File Prior to Visit  Medication Sig Dispense Refill  . albuterol (PROVENTIL) (2.5 MG/3ML) 0.083% nebulizer solution Take 3 mLs (2.5 mg total) by nebulization every 6 (six) hours as needed for wheezing or shortness of breath. 75 mL 12  . amoxicillin (AMOXIL) 500 MG tablet Take 1 tablet (500 mg total) by mouth 2 (two) times daily. 14 tablet 0  . aspirin 81 MG tablet Take 81 mg by mouth daily.    Marland Kitchen azelastine (ASTELIN) 0.1 % nasal spray Place 1 spray into both nostrils 2 (two) times daily. Use in each nostril as directed 30 mL 5  . budesonide (PULMICORT) 0.5 MG/2ML nebulizer solution Take 2 mLs (0.5 mg total) by nebulization 2 (two) times daily. 120 mL 11  . fluticasone (FLONASE) 50 MCG/ACT nasal spray Place 1 spray into both nostrils daily. 16 g 5  . Fluticasone-Umeclidin-Vilant (TRELEGY ELLIPTA) 100-62.5-25 MCG/INH AEPB Inhale 1 puff into the lungs daily. 2 each 0  . ipratropium-albuterol (DUONEB) 0.5-2.5 (3) MG/3ML SOLN Take 3 mLs by nebulization 4 (four) times daily. 360 mL 11  . lisinopril (PRINIVIL,ZESTRIL) 10 MG tablet Take 5 mg by mouth every morning. TAKES 1/2 TABLET    . loratadine (CLARITIN) 10 MG tablet Take 10 mg by mouth daily.    . montelukast (SINGULAIR) 10 MG tablet Take 10 mg by mouth at bedtime.    . Multiple Vitamin (MULTIVITAMIN) tablet Take 1 tablet by mouth daily.    . Multiple Vitamins-Minerals (OCUVITE PO) Take 1 tablet by mouth daily.    . Omega-3 Fatty Acids (FISH OIL) 1000 MG CAPS Take 1 capsule by mouth daily.    Marland Kitchen saccharomyces boulardii (FLORASTOR) 250 MG capsule Take 250 mg by mouth daily.     No current facility-administered medications on file prior to visit.    Allergies  Allergen Reactions  . Pantoprazole     Pt feels it has been causing some diarrhea  .  Spiriva [Tiotropium Bromide Monohydrate]     Pt feels that this causes constipation  . Tramadol Nausea Only

## 2018-04-19 ENCOUNTER — Telehealth: Payer: Self-pay | Admitting: Internal Medicine

## 2018-04-19 DIAGNOSIS — K111 Hypertrophy of salivary gland: Secondary | ICD-10-CM | POA: Diagnosis not present

## 2018-04-19 DIAGNOSIS — Z7289 Other problems related to lifestyle: Secondary | ICD-10-CM | POA: Diagnosis not present

## 2018-04-19 DIAGNOSIS — J449 Chronic obstructive pulmonary disease, unspecified: Secondary | ICD-10-CM

## 2018-04-19 DIAGNOSIS — H6123 Impacted cerumen, bilateral: Secondary | ICD-10-CM | POA: Diagnosis not present

## 2018-04-19 DIAGNOSIS — J343 Hypertrophy of nasal turbinates: Secondary | ICD-10-CM | POA: Diagnosis not present

## 2018-04-19 MED ORDER — FLUTICASONE-UMECLIDIN-VILANT 100-62.5-25 MCG/INH IN AEPB: 1.0000 | INHALATION_SPRAY | Freq: Every day | RESPIRATORY_TRACT | 5 refills | Status: DC

## 2018-04-19 NOTE — Telephone Encounter (Signed)
Spoke with pt's daughter  and advised rx sent to pharmacy. Nothing further is needed.   She states the Trelegy is really working for her.

## 2018-04-28 ENCOUNTER — Other Ambulatory Visit: Payer: Self-pay | Admitting: Internal Medicine

## 2018-05-06 ENCOUNTER — Other Ambulatory Visit: Payer: Self-pay | Admitting: Otolaryngology

## 2018-05-06 DIAGNOSIS — K118 Other diseases of salivary glands: Secondary | ICD-10-CM

## 2018-05-27 ENCOUNTER — Other Ambulatory Visit: Payer: Medicare Other

## 2018-05-28 ENCOUNTER — Ambulatory Visit
Admission: RE | Admit: 2018-05-28 | Discharge: 2018-05-28 | Disposition: A | Discharge status: Home / Self Care | Payer: Medicare Other | Source: Ambulatory Visit | Attending: Otolaryngology | Admitting: Otolaryngology

## 2018-05-28 DIAGNOSIS — K118 Other diseases of salivary glands: Secondary | ICD-10-CM

## 2018-05-28 DIAGNOSIS — R221 Localized swelling, mass and lump, neck: Secondary | ICD-10-CM | POA: Diagnosis not present

## 2018-05-28 MED ORDER — IOPAMIDOL (ISOVUE-300) INJECTION 61%
75.0000 mL | Freq: Once | INTRAVENOUS | Status: AC | PRN
  Administered 2018-05-28: 75 mL via INTRAVENOUS

## 2018-06-15 DIAGNOSIS — R590 Localized enlarged lymph nodes: Secondary | ICD-10-CM | POA: Diagnosis not present

## 2018-06-15 DIAGNOSIS — K118 Other diseases of salivary glands: Secondary | ICD-10-CM | POA: Diagnosis not present

## 2018-06-15 DIAGNOSIS — K119 Disease of salivary gland, unspecified: Secondary | ICD-10-CM | POA: Diagnosis not present

## 2018-07-02 ENCOUNTER — Encounter: Payer: Self-pay | Admitting: Internal Medicine

## 2018-07-02 ENCOUNTER — Ambulatory Visit (INDEPENDENT_AMBULATORY_CARE_PROVIDER_SITE_OTHER): Payer: Medicare Other | Admitting: Internal Medicine

## 2018-07-02 VITALS — BP 122/78 | HR 83

## 2018-07-02 DIAGNOSIS — J449 Chronic obstructive pulmonary disease, unspecified: Secondary | ICD-10-CM

## 2018-07-02 DIAGNOSIS — J849 Interstitial pulmonary disease, unspecified: Secondary | ICD-10-CM | POA: Diagnosis not present

## 2018-07-02 DIAGNOSIS — J9611 Chronic respiratory failure with hypoxia: Secondary | ICD-10-CM | POA: Diagnosis not present

## 2018-07-02 NOTE — Patient Instructions (Signed)
O2 requalifying  Ok to continue Trelegy - glad it helps  Please call as needed

## 2018-07-02 NOTE — Assessment & Plan Note (Signed)
Favor this being NSIP.  Birds are gone at hypersensitivity pneumonia panel was negative.  We will continue to watch over time but I do not think OFEV or Esbriet would make her life better.

## 2018-07-02 NOTE — Assessment & Plan Note (Signed)
She is not changing her lifestyle to accomplish weight loss and I do not really expect to see a change.

## 2018-07-02 NOTE — Progress Notes (Signed)
HPI 84 year old female never smoker (second hand smoke when husband was alive) followed for COPD, chronic hypoxic respiratory failure, with history of progressive dyspnea 3 years, interstitial groundglass changes. Had had birds and cat in home previously. Birds are gone now.. Labs 06/2016-negative for Aspergillus antigen, cryptococcal antigen, Sjogren's, scleroderma, anti-DNA, ANCA, She had her husband had lived for many years in a conspicuously moldy home according to daughter, but has moved to a different home. PFT 08/15/16-moderate restriction  with no response to dilator, mild reduction diffusion capacity. FVC 1.22/54%, FEV1 1.06/65%,FEF25-75%  ratio 0.87, TLC 76%, DLCO 63% Home sleep test 08/27/16-AHI 18.3/hour, desaturation to 56% with average saturation 88%, body weight 243 pounds. She could not get comfortable with CPAP and did not continue. CT chest 05/14/16-Interstitial lung disease characterized by patchy reticulation and ground-glass attenuation with mild architectural distortion throughout both lungs hypersensitivity pneumonitis, with the differential including nonspecific interstitial pneumonia (NSIP). These findings are not consistent with usual interstitial pneumonia (UIP PFT 08/31/17-severe obstructive airways disease with response to bronchodilator, moderate restriction, moderate reduction of diffusion.  FVC 1.20/55%, FEV1 1.00/63%, ratio 0.83, TLC 75%, DLCO 56%  Hypersensitivity Pneumonia Panel-07/24/17-negative/WNL Respiratory Allergy Profile 07/24/17- total IgE 280, no specific elevations D-dimer 07/24/17-2.29 H CBC 07/24/17-WBC 12,100, eosinophils 0.9 H ACE level 07/24/17-  < 5  CT chest 07/28/17-hypersensitivity pneumonitis, with the differential including nonspecific interstitial pneumonia (NSIP). These findings are not consistent with usual interstitial pneumonia (UIP) Walk Test 07/02/2018-requalified for home oxygen with desaturation to  87% -------------------------------------------------------------------------------------------------------- 02/26/18- 84 year old female never smoker (second hand smoke when husband was alive) followed for history of progressive dyspnea 3 years, interstitial groundglass changes/ NSIP. Had had birds and cat in home previously. Birds are gone now., OSA, complicated by CAD/aortic atherosclerosis O2 3L/ [facility] ----COPD mixed type: Pt continues to use O2. Pt is having SOB, wheezing, cough, congestion, fatiuged as well-daughter states patient is doing better since coming here but continues to have concerns with breathing. Neb DuoNeb/Pulmicort They question sinus infection-green nasal discharge, malaise, some headache.  No fever. Very much like Dymista nasal spray, too expensive.  Now using Astelin. Nebulizer treatments with DuoNeb/Pulmicort 2-4 times daily, working well. Dr. Felipa Eth started Singulair.  07/02/2018- 84 year old female never smoker (second hand smoke when husband was alive) followed for history of progressive dyspnea 3 years, interstitial groundglass changes/ NSIP. Had had birds and cat in home previously. Birds are gone now., OSA, complicated by CAD/aortic atherosclerosis O2 3L/ [facility]     Requalifying today CT soft tissue neck 05/28/2018-bilateral parotid masses, cervical and mediastinal adenopathy, asymmetric fullness right nasopharynx, C-spine stenosis Needle biopsy head and neck 06/15/2018-lymphoid hyperplasia COPD: Pt states she is doing well with her breathing. Pt needs to have OV notes and O2 levels sent to [facility] for recert.  Trelegy has worked wonders for her.  Denies cough or wheeze.  Oxygen does help.  Now on cephalexin for lymphoid hyperplasia which had seemed to improve after antibiotic given for dental extraction.  Has not been needing her nebulizer machine. Walk Test 07/02/2018-requalified for home oxygen with desaturation to 87%  ROS-see HPI   + =  Positive Constitutional:    weight loss, night sweats, fevers, chills, fatigue, lassitude. HEENT:    headaches, difficulty swallowing, tooth/dental problems, sore throat,       sneezing, itching, ear ache, nasal congestion, +post nasal drip, snoring CV:    chest pain, orthopnea, PND, + swelling in lower extremities, anasarca,  dizziness, palpitations Resp:   +shortness of breath with exertion or at rest.                productive cough,   non-productive cough, coughing up of blood.              change in color of mucus.  wheezing.   Skin:    rash or lesions. GI:  No-   heartburn, indigestion, abdominal pain, nausea, vomiting, diarrhea,                 change in bowel habits, loss of appetite GU: dysuria, change in color of urine, no urgency or frequency.   flank pain. MS:   joint pain, stiffness, decreased range of motion, back pain. Neuro-     nothing unusual Psych:  change in mood or affect.  depression or anxiety.   memory loss.  OBJ- Physical Exam  + morbidly obese, + wheelchair, + O2 3 L General- Alert, Oriented, Affect-appropriate, Distress- none acute,  Skin- rash-none, lesions- none, excoriation- none Lymphadenopathy- none Head- atraumatic            Eyes- Gross vision intact, PERRLA, conjunctivae and secretions clear            Ears- Hearing, canals-normal            Nose- Clear, no-Septal dev, mucus, polyps, erosion, perforation             Throat- Mallampati IV , mucosa clear , drainage- none, tonsils- atrophic Neck- flexible , trachea midline, no stridor , thyroid nl, carotid no bruit Chest - symmetrical excursion , unlabored           Heart/CV- RRR , no murmur , no gallop  , no rub, nl s1 s2                           - JVD- none , edema 1+ , stasis changes + pink bilaterally, varices- none           Lung-  wheeze-none, cough- none , dullness-none, rub- none           Chest wall-  Abd-  Br/ Gen/ Rectal- Not done, not  indicated Extrem- cyanosis- none, clubbing, none, atrophy- none, strength- nl, Homan's NEG Neuro- grossly intact to observation

## 2018-07-02 NOTE — Assessment & Plan Note (Signed)
She gets some benefit from inhalers for reactive airways component.  There is a significant component of restriction from her obesity hypoventilation body habitus. Plan-continue Trelegy, use nebulizer as needed

## 2018-07-02 NOTE — Assessment & Plan Note (Signed)
Requalified for home oxygen today with room air saturation 87%. Plan-continue oxygen 3 L

## 2018-08-10 DIAGNOSIS — R609 Edema, unspecified: Secondary | ICD-10-CM | POA: Diagnosis not present

## 2018-08-10 DIAGNOSIS — R590 Localized enlarged lymph nodes: Secondary | ICD-10-CM | POA: Diagnosis not present

## 2018-08-20 ENCOUNTER — Other Ambulatory Visit: Payer: Self-pay | Admitting: Internal Medicine

## 2018-08-20 DIAGNOSIS — J449 Chronic obstructive pulmonary disease, unspecified: Secondary | ICD-10-CM

## 2018-09-03 DIAGNOSIS — K118 Other diseases of salivary glands: Secondary | ICD-10-CM | POA: Diagnosis not present

## 2018-09-03 DIAGNOSIS — K1123 Chronic sialoadenitis: Secondary | ICD-10-CM | POA: Diagnosis not present

## 2018-09-17 ENCOUNTER — Other Ambulatory Visit: Payer: Self-pay | Admitting: Otolaryngology

## 2018-09-17 ENCOUNTER — Telehealth: Payer: Self-pay | Admitting: Internal Medicine

## 2018-09-17 DIAGNOSIS — Z23 Encounter for immunization: Secondary | ICD-10-CM | POA: Diagnosis not present

## 2018-09-17 DIAGNOSIS — R591 Generalized enlarged lymph nodes: Secondary | ICD-10-CM | POA: Diagnosis not present

## 2018-09-17 DIAGNOSIS — J8410 Pulmonary fibrosis, unspecified: Secondary | ICD-10-CM | POA: Diagnosis not present

## 2018-09-17 DIAGNOSIS — N183 Chronic kidney disease, stage 3 (moderate): Secondary | ICD-10-CM | POA: Diagnosis not present

## 2018-09-17 DIAGNOSIS — J984 Other disorders of lung: Secondary | ICD-10-CM | POA: Diagnosis not present

## 2018-09-17 DIAGNOSIS — J9611 Chronic respiratory failure with hypoxia: Secondary | ICD-10-CM | POA: Diagnosis not present

## 2018-09-17 DIAGNOSIS — I129 Hypertensive chronic kidney disease with stage 1 through stage 4 chronic kidney disease, or unspecified chronic kidney disease: Secondary | ICD-10-CM | POA: Diagnosis not present

## 2018-09-17 DIAGNOSIS — Z79899 Other long term (current) drug therapy: Secondary | ICD-10-CM | POA: Diagnosis not present

## 2018-09-17 DIAGNOSIS — J449 Chronic obstructive pulmonary disease, unspecified: Secondary | ICD-10-CM | POA: Diagnosis not present

## 2018-09-17 NOTE — Telephone Encounter (Signed)
Do they just need a verbal? Letter needed? Fax number? LMTCB for [employer]

## 2018-09-17 NOTE — Telephone Encounter (Signed)
Lattie Haw with Dr. Redmond Baseman returned call.  States if can send a note to Dr. Redmond Baseman clearing patient for pulmonary.  Fax [phone].

## 2018-09-17 NOTE — Telephone Encounter (Signed)
Dr Annamaria Boots- please advise if okay for pt to have excisional biopsy of a cervical lymph node. Dr Redmond Baseman' office needs a letter clearing her from a pulmonary standpoint. Thanks!

## 2018-09-20 ENCOUNTER — Encounter: Payer: Self-pay | Admitting: Internal Medicine

## 2018-09-20 NOTE — Telephone Encounter (Signed)
Received letter from Dr. Annamaria Boots and will fax to Dr. Redmond Baseman . Nothing further is needed.

## 2018-10-07 ENCOUNTER — Encounter (HOSPITAL_COMMUNITY): Payer: Self-pay | Admitting: Vascular Surgery

## 2018-10-07 ENCOUNTER — Other Ambulatory Visit: Payer: Self-pay

## 2018-10-07 NOTE — Progress Notes (Signed)
SDW-pre-op call completed by pt daughter, Baxter Flattery.  Daughter denies any acute pulmonary issues. Daughter denies that pt is under the care of a cardiologist. Daughter denies that pt had a stress test and cardiac cath. Daughter denies recent labs. Daughter made aware to have pt stop taking Aspirin ( unless advised otherwise by the doctor), vitamins, fish oil, and herbal medications. Do not take any NSAIDs ie: Ibuprofen, Advil, Naproxen (alevve), Motrin, BC and Goody Powder. Daughter verbalized understanding of all pre-op instructions. See anesthesia note.

## 2018-10-07 NOTE — Anesthesia Preprocedure Evaluation (Addendum)
Anesthesia Evaluation  Patient identified by MRN, date of birth, ID band Patient awake    Reviewed: Allergy & Precautions, H&P , NPO status , Patient's Chart, lab work & pertinent test results  Airway Mallampati: II  TM Distance: >3 FB Neck ROM: Full    Dental no notable dental hx. (+) Teeth Intact, Dental Advisory Given   Pulmonary shortness of breath and Long-Term Oxygen Therapy, sleep apnea , COPD,  COPD inhaler and oxygen dependent,    Pulmonary exam normal breath sounds clear to auscultation       Cardiovascular Exercise Tolerance: Good hypertension, On Medications  Rhythm:Regular Rate:Normal     Neuro/Psych  Headaches, negative psych ROS   GI/Hepatic Neg liver ROS, GERD  Controlled,  Endo/Other  Morbid obesity  Renal/GU negative Renal ROS  negative genitourinary   Musculoskeletal  (+) Arthritis , Osteoarthritis,    Abdominal   Peds  Hematology  (+) Blood dyscrasia, anemia ,   Anesthesia Other Findings   Reproductive/Obstetrics negative OB ROS                           Anesthesia Physical Anesthesia Plan  ASA: III  Anesthesia Plan: General   Post-op Pain Management:    Induction: Intravenous  PONV Risk Score and Plan: 4 or greater and Ondansetron, Dexamethasone and Treatment may vary due to age or medical condition  Airway Management Planned: Oral ETT  Additional Equipment:   Intra-op Plan:   Post-operative Plan: Extubation in OR and Possible Post-op intubation/ventilation  Informed Consent: I have reviewed the patients History and Physical, chart, labs and discussed the procedure including the risks, benefits and alternatives for the proposed anesthesia with the patient or authorized representative who has indicated his/her understanding and acceptance.   Dental advisory given  Plan Discussed with: CRNA  Anesthesia Plan Comments: (See PAT note written 10/07/2018 by  Myra Gianotti, PA-C. )       Anesthesia Quick Evaluation

## 2018-10-07 NOTE — Progress Notes (Addendum)
Anesthesia Chart Review: Shelia Gardner  Case:  [id number] Date/Time:  10/08/18 0715   Procedure:  EXCISIONAL BIOPSY OF LEFT CERVICAL LYMPH (Left )   Anesthesia type:  General   Pre-op diagnosis:  cervical lymphadenopathy   Location:  [facility] OR ROOM 09 / [facility]   Surgeon:  Melida Quitter, MD      DISCUSSION: Patient is a 84 year old female scheduled for the above procedure.   History includes passive smoke exposure, COPD with chronic hypoxic respiratory failure (3L/[place] home O2), interstitial lung disease, HTN, exertional dyspnea, GERD, obesity.  Dr. Redmond Baseman requested pulmonology preoperative risk assessment. According to Dr. Annamaria Boots (see Letters tab), she was stable at his last visit with her in July for COPD mixed type, interstitial lung disease (favored to be NSIP) and chronic hypoxic respiratory failure. "She can go forward with necessary surgery, but should be watched closely for cardiopulmonary problems. Her multiple health problems put her at higher than average risk for complications."  Will request most recent office note, labs, and EKG from Dr. Carlyle Lipa office, but if without recent labs and EKG then she will need those on the day of surgery.    PROVIDERS: Lajean Manes, MD is PCP Baird Lyons, MD is pulmonologist. Last visit 07/02/18.    LABS: Last labs noted are from 2018.   PFTs 08/31/17: PRE-BD: FVC 1.09 (50%), FEV1 0.82 (51%). POST-BD: FVC 12.0 (55%), FEV1 1.00 (63%). DLCO cor 11.47 (53%). DLCO unc 12.08 (56%). Severe obstructive airways disease with response to BD, moderate restriction, moderate reduction of diffusion.   Walk Test 07/02/2018-Requalified for home oxygen with desaturation to 87%   IMAGES: CT soft tissue neck 05/28/18: IMPRESSION: 1. Bilateral parotid masses in addition to cervical and mediastinal lymphadenopathy. Constellation of findings seen with lymphoproliferative disease including lymphoma, less likely metastasis. 2. Asymmetric fullness RIGHT  nasopharynx, possible lymphoid hyperplasia though, recommend direct inspection. Tumor not excluded. 3. Severe canal stenosis C5-6.   EKG: Last noted EKG is from 10/09/13.    CV: Echo 08/08/16: Study Conclusions - Left ventricle: The cavity size was normal. Wall thickness was   increased in a pattern of mild LVH. Systolic function was normal.   The estimated ejection fraction was in the range of 60% to 65%.   Wall motion was normal; there were no regional wall motion   abnormalities. Doppler parameters are consistent with abnormal   left ventricular relaxation (grade 1 diastolic dysfunction). The   E;e&' ratio is between 8-15, suggesting indeterminate LV fililng   pressure. - Mitral valve: Mildly thickened leaflets . There was trivial   regurgitation. - Left atrium: The atrium was normal in size. - Right ventricle: The cavity size was mildly dilated. Systolic   function is decreased. - Tricuspid valve: There was mild regurgitation. - Pulmonary arteries: PA peak pressure: 35 mm Hg (S). - Inferior vena cava: The vessel was normal in size. The   respirophasic diameter changes were in the normal range (>= 50%),   consistent with normal central venous pressure. Impressions: - LVEF 60-65%, mild LVH, normal wall motion, diastolic dysfunction,   indeterminate LV filling pressure, trivial MR, normal LA size,   mild TR, RVSP 35 mmHg, normal IVC.   Past Medical History:  Diagnosis Date  . Arthritis   . COPD mixed type ([place])   . GERD (gastroesophageal reflux disease)   . 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 Hypertension   . Interstitial lung disease ([place])   . Shortness  of breath    with exertion    Past Surgical History:  Procedure Laterality Date  . BREAST LUMPECTOMY WITH AXILLARY LYMPH NODE DISSECTION     right breast  . TOTAL KNEE ARTHROPLASTY Right 10/05/2013   Procedure: TOTAL RIGHT  KNEE ARTHROPLASTY;  Surgeon: Tobi Bastos, MD;  Location: [facility]  ORS;  Service: Orthopedics;  Laterality: Right;    MEDICATIONS: No current facility-administered medications for this encounter.    Marland Kitchen acetaminophen (TYLENOL) 500 MG tablet  . albuterol (PROVENTIL) (2.5 MG/3ML) 0.083% nebulizer solution  . aspirin 81 MG tablet  . budesonide (PULMICORT) 0.5 MG/2ML nebulizer solution  . fexofenadine (ALLEGRA) 180 MG tablet  . ipratropium-albuterol (DUONEB) 0.5-2.5 (3) MG/3ML SOLN  . lisinopril (PRINIVIL,ZESTRIL) 5 MG tablet  . montelukast (SINGULAIR) 10 MG tablet  . Multiple Vitamin (MULTIVITAMIN) tablet  . Multiple Vitamins-Minerals (OCUVITE PO)  . Omega-3 Fatty Acids (FISH OIL) 1000 MG CAPS  . TRELEGY ELLIPTA 100-62.5-25 MCG/INH AEPB    George Hugh [facility] Short Stay Center/Anesthesiology Phone [phone] 10/07/2018 9:42 AM

## 2018-10-08 ENCOUNTER — Ambulatory Visit (HOSPITAL_COMMUNITY)
Admission: RE | Admit: 2018-10-08 | Discharge: 2018-10-08 | Disposition: A | Discharge status: Home / Self Care | Payer: Medicare Other | Source: Ambulatory Visit | Attending: Otolaryngology | Admitting: Otolaryngology

## 2018-10-08 ENCOUNTER — Ambulatory Visit (HOSPITAL_COMMUNITY): Payer: Medicare Other | Admitting: Vascular Surgery

## 2018-10-08 ENCOUNTER — Encounter (HOSPITAL_COMMUNITY): Payer: Self-pay

## 2018-10-08 ENCOUNTER — Other Ambulatory Visit: Payer: Self-pay

## 2018-10-08 ENCOUNTER — Encounter (HOSPITAL_COMMUNITY)
Admission: RE | Disposition: A | Discharge status: Home / Self Care | Payer: Self-pay | Source: Ambulatory Visit | Attending: Otolaryngology

## 2018-10-08 DIAGNOSIS — G4730 Sleep apnea, unspecified: Secondary | ICD-10-CM | POA: Diagnosis not present

## 2018-10-08 DIAGNOSIS — Z7982 Long term (current) use of aspirin: Secondary | ICD-10-CM | POA: Insufficient documentation

## 2018-10-08 DIAGNOSIS — Z79899 Other long term (current) drug therapy: Secondary | ICD-10-CM | POA: Diagnosis not present

## 2018-10-08 DIAGNOSIS — R069 Unspecified abnormalities of breathing: Secondary | ICD-10-CM | POA: Diagnosis not present

## 2018-10-08 DIAGNOSIS — I10 Essential (primary) hypertension: Secondary | ICD-10-CM | POA: Diagnosis not present

## 2018-10-08 DIAGNOSIS — J449 Chronic obstructive pulmonary disease, unspecified: Secondary | ICD-10-CM | POA: Diagnosis not present

## 2018-10-08 DIAGNOSIS — I129 Hypertensive chronic kidney disease with stage 1 through stage 4 chronic kidney disease, or unspecified chronic kidney disease: Secondary | ICD-10-CM | POA: Diagnosis not present

## 2018-10-08 DIAGNOSIS — Z9981 Dependence on supplemental oxygen: Secondary | ICD-10-CM | POA: Insufficient documentation

## 2018-10-08 DIAGNOSIS — N189 Chronic kidney disease, unspecified: Secondary | ICD-10-CM | POA: Insufficient documentation

## 2018-10-08 DIAGNOSIS — R590 Localized enlarged lymph nodes: Secondary | ICD-10-CM | POA: Insufficient documentation

## 2018-10-08 DIAGNOSIS — K219 Gastro-esophageal reflux disease without esophagitis: Secondary | ICD-10-CM | POA: Insufficient documentation

## 2018-10-08 DIAGNOSIS — R591 Generalized enlarged lymph nodes: Secondary | ICD-10-CM | POA: Diagnosis not present

## 2018-10-08 HISTORY — DX: Malignant (primary) neoplasm, unspecified: C80.1

## 2018-10-08 HISTORY — PX: LYMPH NODE BIOPSY: SHX201

## 2018-10-08 HISTORY — DX: Interstitial pulmonary disease, unspecified: J84.9

## 2018-10-08 HISTORY — DX: Chronic obstructive pulmonary disease, unspecified: J44.9

## 2018-10-08 HISTORY — DX: Chronic kidney disease, unspecified: N18.9

## 2018-10-08 HISTORY — DX: Localized enlarged lymph nodes: R59.0

## 2018-10-08 HISTORY — DX: Sleep apnea, unspecified: G47.30

## 2018-10-08 LAB — CBC
HEMATOCRIT: 41 % (ref 36.0–46.0)
HEMOGLOBIN: 12.8 g/dL (ref 12.0–15.0)
MCH: 29.2 pg (ref 26.0–34.0)
MCHC: 31.2 g/dL (ref 30.0–36.0)
MCV: 93.6 fL (ref 80.0–100.0)
NRBC: 0 % (ref 0.0–0.2)
Platelets: 255 10*3/uL (ref 150–400)
RBC: 4.38 MIL/uL (ref 3.87–5.11)
RDW: 13.9 % (ref 11.5–15.5)
WBC: 13.3 10*3/uL — AB (ref 4.0–10.5)

## 2018-10-08 LAB — BASIC METABOLIC PANEL
ANION GAP: 6 (ref 5–15)
BUN: 15 mg/dL (ref 8–23)
CO2: 25 mmol/L (ref 22–32)
Calcium: 8.6 mg/dL — ABNORMAL LOW (ref 8.9–10.3)
Chloride: 106 mmol/L (ref 98–111)
Creatinine, Ser: 1.17 mg/dL — ABNORMAL HIGH (ref 0.44–1.00)
GFR calc non Af Amer: 41 mL/min — ABNORMAL LOW (ref >60)
GFR, EST AFRICAN AMERICAN: 48 mL/min — AB (ref >60)
Glucose, Bld: 102 mg/dL — ABNORMAL HIGH (ref 70–99)
Potassium: 4.2 mmol/L (ref 3.5–5.1)
Sodium: 137 mmol/L (ref 135–145)

## 2018-10-08 SURGERY — LYMPH NODE BIOPSY
Anesthesia: General | Laterality: Left

## 2018-10-08 MED ORDER — ROCURONIUM BROMIDE 50 MG/5ML IV SOSY
PREFILLED_SYRINGE | INTRAVENOUS | Status: AC
  Filled 2018-10-08: qty 5

## 2018-10-08 MED ORDER — ONDANSETRON HCL 4 MG/2ML IJ SOLN
INTRAMUSCULAR | Status: AC
  Filled 2018-10-08: qty 2

## 2018-10-08 MED ORDER — DEXMEDETOMIDINE HCL 200 MCG/2ML IV SOLN
INTRAVENOUS | Status: DC | PRN
  Administered 2018-10-08: 4 ug via INTRAVENOUS

## 2018-10-08 MED ORDER — PROPOFOL 10 MG/ML IV BOLUS
INTRAVENOUS | Status: AC
  Filled 2018-10-08: qty 20

## 2018-10-08 MED ORDER — LIDOCAINE 2% (20 MG/ML) 5 ML SYRINGE
INTRAMUSCULAR | Status: DC | PRN
  Administered 2018-10-08: 100 mg via INTRAVENOUS

## 2018-10-08 MED ORDER — FENTANYL CITRATE (PF) 100 MCG/2ML IJ SOLN: 25.0000 ug | INTRAMUSCULAR | Status: DC | PRN

## 2018-10-08 MED ORDER — FENTANYL CITRATE (PF) 100 MCG/2ML IJ SOLN
INTRAMUSCULAR | Status: DC | PRN
  Administered 2018-10-08: 50 ug via INTRAVENOUS

## 2018-10-08 MED ORDER — ROCURONIUM BROMIDE 10 MG/ML (PF) SYRINGE
PREFILLED_SYRINGE | INTRAVENOUS | Status: DC | PRN
  Administered 2018-10-08: 30 mg via INTRAVENOUS

## 2018-10-08 MED ORDER — HYDROCODONE-ACETAMINOPHEN 5-325 MG PO TABS: 1.0000 | ORAL_TABLET | Freq: Four times a day (QID) | ORAL | 0 refills | Status: DC | PRN

## 2018-10-08 MED ORDER — ONDANSETRON HCL 4 MG/2ML IJ SOLN
INTRAMUSCULAR | Status: DC | PRN
  Administered 2018-10-08: 4 mg via INTRAVENOUS

## 2018-10-08 MED ORDER — LIDOCAINE-EPINEPHRINE 1 %-1:100000 IJ SOLN
INTRAMUSCULAR | Status: DC | PRN
  Administered 2018-10-08: 20 mL via INTRADERMAL

## 2018-10-08 MED ORDER — CEFAZOLIN SODIUM-DEXTROSE 2-4 GM/100ML-% IV SOLN
2.0000 g | INTRAVENOUS | Status: AC
  Administered 2018-10-08: 2 g via INTRAVENOUS
  Filled 2018-10-08: qty 100

## 2018-10-08 MED ORDER — FENTANYL CITRATE (PF) 250 MCG/5ML IJ SOLN
INTRAMUSCULAR | Status: AC
  Filled 2018-10-08: qty 5

## 2018-10-08 MED ORDER — PROPOFOL 10 MG/ML IV BOLUS
INTRAVENOUS | Status: DC | PRN
  Administered 2018-10-08: 20 mg via INTRAVENOUS
  Administered 2018-10-08: 100 mg via INTRAVENOUS
  Administered 2018-10-08 (×2): 50 mg via INTRAVENOUS
  Administered 2018-10-08: 10 mg via INTRAVENOUS

## 2018-10-08 MED ORDER — LIDOCAINE 2% (20 MG/ML) 5 ML SYRINGE
INTRAMUSCULAR | Status: AC
  Filled 2018-10-08: qty 5

## 2018-10-08 MED ORDER — SUGAMMADEX SODIUM 200 MG/2ML IV SOLN
INTRAVENOUS | Status: DC | PRN
  Administered 2018-10-08: 300 mg via INTRAVENOUS

## 2018-10-08 MED ORDER — LACTATED RINGERS IV SOLN
INTRAVENOUS | Status: DC | PRN
  Administered 2018-10-08: 07:00:00 via INTRAVENOUS

## 2018-10-08 MED ORDER — 0.9 % SODIUM CHLORIDE (POUR BTL) OPTIME
TOPICAL | Status: DC | PRN
  Administered 2018-10-08: 1000 mL

## 2018-10-08 MED ORDER — ESMOLOL HCL 100 MG/10ML IV SOLN
INTRAVENOUS | Status: AC
  Filled 2018-10-08: qty 10

## 2018-10-08 MED ORDER — DEXAMETHASONE SODIUM PHOSPHATE 10 MG/ML IJ SOLN
INTRAMUSCULAR | Status: AC
  Filled 2018-10-08: qty 1

## 2018-10-08 MED ORDER — BACITRACIN ZINC 500 UNIT/GM EX OINT
TOPICAL_OINTMENT | CUTANEOUS | Status: AC
  Filled 2018-10-08: qty 28.35

## 2018-10-08 MED ORDER — DEXAMETHASONE SODIUM PHOSPHATE 10 MG/ML IJ SOLN
INTRAMUSCULAR | Status: DC | PRN
  Administered 2018-10-08: 10 mg via INTRAVENOUS

## 2018-10-08 MED ORDER — LIDOCAINE-EPINEPHRINE 1 %-1:100000 IJ SOLN
INTRAMUSCULAR | Status: AC
  Filled 2018-10-08: qty 1

## 2018-10-08 SURGICAL SUPPLY — 45 items
BLADE SURG 15 STRL LF DISP TIS (BLADE) IMPLANT
BLADE SURG 15 STRL SS (BLADE)
CANISTER SUCT 3000ML PPV (MISCELLANEOUS) ×3 IMPLANT
CLEANER TIP ELECTROSURG 2X2 (MISCELLANEOUS) ×3 IMPLANT
CONT SPEC 4OZ CLIKSEAL STRL BL (MISCELLANEOUS) ×3 IMPLANT
COVER SURGICAL LIGHT HANDLE (MISCELLANEOUS) ×3 IMPLANT
COVER WAND RF STERILE (DRAPES) ×3 IMPLANT
CRADLE DONUT ADULT HEAD (MISCELLANEOUS) ×3 IMPLANT
DERMABOND ADVANCED (GAUZE/BANDAGES/DRESSINGS) ×2
DERMABOND ADVANCED .7 DNX12 (GAUZE/BANDAGES/DRESSINGS) ×1 IMPLANT
DRAIN PENROSE 1/4X12 LTX STRL (WOUND CARE) IMPLANT
DRAPE HALF SHEET 40X57 (DRAPES) IMPLANT
DRSG EMULSION OIL 3X3 NADH (GAUZE/BANDAGES/DRESSINGS) IMPLANT
ELECT COATED BLADE 2.86 ST (ELECTRODE) ×3 IMPLANT
ELECT NEEDLE TIP 2.8 STRL (NEEDLE) IMPLANT
ELECT REM PT RETURN 9FT ADLT (ELECTROSURGICAL) ×3
ELECTRODE REM PT RTRN 9FT ADLT (ELECTROSURGICAL) ×1 IMPLANT
GAUZE SPONGE 4X4 12PLY STRL (GAUZE/BANDAGES/DRESSINGS) IMPLANT
GLOVE BIO SURGEON STRL SZ7.5 (GLOVE) ×3 IMPLANT
GLOVE BIOGEL PI IND STRL 8 (GLOVE) ×1 IMPLANT
GLOVE BIOGEL PI INDICATOR 8 (GLOVE) ×2
GOWN STRL REUS W/ TWL LRG LVL3 (GOWN DISPOSABLE) ×2 IMPLANT
GOWN STRL REUS W/TWL LRG LVL3 (GOWN DISPOSABLE) ×4
KIT BASIN OR (CUSTOM PROCEDURE TRAY) ×3 IMPLANT
KIT TURNOVER KIT B (KITS) IMPLANT
NEEDLE HYPO 25GX1X1/2 BEV (NEEDLE) ×3 IMPLANT
NS IRRIG 1000ML POUR BTL (IV SOLUTION) ×3 IMPLANT
PAD ARMBOARD 7.5X6 YLW CONV (MISCELLANEOUS) ×3 IMPLANT
PENCIL BUTTON HOLSTER BLD 10FT (ELECTRODE) ×3 IMPLANT
SUT CHROMIC 4 0 P 3 18 (SUTURE) IMPLANT
SUT ETHILON 4 0 PS 2 18 (SUTURE) IMPLANT
SUT ETHILON 5 0 P 3 18 (SUTURE)
SUT NYLON ETHILON 5-0 P-3 1X18 (SUTURE) IMPLANT
SUT SILK 3 0 (SUTURE) ×2
SUT SILK 3-0 18XBRD TIE 12 (SUTURE) ×1 IMPLANT
SUT SILK 4 0 (SUTURE)
SUT SILK 4-0 18XBRD TIE 12 (SUTURE) IMPLANT
SUT VIC AB 3-0 SH 27 (SUTURE) ×2
SUT VIC AB 3-0 SH 27X BRD (SUTURE) ×1 IMPLANT
SUT VIC AB 4-0 PS2 27 (SUTURE) ×3 IMPLANT
SWAB COLLECTION DEVICE MRSA (MISCELLANEOUS) IMPLANT
SWAB CULTURE ESWAB REG 1ML (MISCELLANEOUS) IMPLANT
SYR BULB IRRIGATION 50ML (SYRINGE) IMPLANT
SYR TB 1ML LUER SLIP (SYRINGE) IMPLANT
TRAY ENT MC OR (CUSTOM PROCEDURE TRAY) ×3 IMPLANT

## 2018-10-08 NOTE — Anesthesia Postprocedure Evaluation (Signed)
Anesthesia Post Note  Patient: Shelia Gardner  Procedure(s) Performed: EXCISIONAL BIOPSY OF LEFT CERVICAL LYMPH (Left )     Patient location during evaluation: PACU Anesthesia Type: General Level of consciousness: awake and alert Pain management: pain level controlled Vital Signs Assessment: post-procedure vital signs reviewed and stable Respiratory status: spontaneous breathing, nonlabored ventilation, respiratory function stable and patient connected to nasal cannula oxygen Cardiovascular status: blood pressure returned to baseline and stable Postop Assessment: no apparent nausea or vomiting Anesthetic complications: no    Last Vitals:  Vitals:   10/08/18 0925 10/08/18 0938  BP: (!) 169/91 (!) 161/98  Pulse: 85 89  Resp: 20 20  Temp:  36.5 C  SpO2: 97% 97%    Last Pain:  Vitals:   10/08/18 0910  TempSrc:   PainSc: 0-No pain                 Talajah Slimp,W. EDMOND

## 2018-10-08 NOTE — Brief Op Note (Signed)
10/08/2018  8:26 AM  PATIENT:  Shelia Gardner  84 y.o. female  PRE-OPERATIVE DIAGNOSIS:  cervical lymphadenopathy  POST-OPERATIVE DIAGNOSIS:  same  PROCEDURE:  Procedure(s): EXCISIONAL BIOPSY OF LEFT CERVICAL LYMPH (Left)  SURGEON:  Surgeon(s) and Role:    Melida Quitter, MD - Primary  PHYSICIAN ASSISTANT:   ASSISTANTS: none   ANESTHESIA:   general  EBL:  Minimal   BLOOD ADMINISTERED:none  DRAINS: none   LOCAL MEDICATIONS USED:  LIDOCAINE   SPECIMEN:  Source of Specimen:  Left zone 1 lymph node  DISPOSITION OF SPECIMEN:  PATHOLOGY  COUNTS:  YES  TOURNIQUET:  * No tourniquets in log *  DICTATION: .Other Dictation: Dictation Number [id number]  PLAN OF CARE: Discharge to home after PACU  PATIENT DISPOSITION:  PACU - hemodynamically stable.   Delay start of Pharmacological VTE agent (>24hrs) due to surgical blood loss or risk of bleeding: no

## 2018-10-08 NOTE — Op Note (Signed)
NAME: Shelia Gardner, Shelia Gardner MEDICAL RECORD OZ:[id number] ACCOUNT [id number] DATE OF BIRTH:11/09/1930 FACILITY: [facility] LOCATION: [facility] PHYSICIAN:Milania Haubner Guido Sander, MD  OPERATIVE REPORT  DATE OF PROCEDURE:  10/08/2018  PREOPERATIVE DIAGNOSIS:  Cervical lymphadenopathy.  POSTOPERATIVE DIAGNOSIS:  Cervical lymphadenopathy.  PROCEDURE:  Excisional biopsy of the left zone 1 cervical lymph nodes.  SURGEON:  Melida Quitter, MD  ANESTHESIA:  General endotracheal anesthesia.  COMPLICATIONS:  None.  INDICATIONS:  The patient is an 84 year old female who has a several month history of bilateral parotid enlargement that was found on imaging to be due to enlarged lymph nodes, likely within the parotid glands.  There was also cervical lymphadenopathy  noted.  Fine needle aspiration was unremarkable and an open salivary gland biopsy was also unremarkable.  She presents to the operating room for excision of a lymph node.  FINDINGS:  She continues to have bulky enlargement of both parotid glands, worse on the left.  There are palpable lymph nodes in zone 1, particularly on the left side and this is what was removed for biopsy.  The lymph node was enlarged and homogeneous  in appearance.  The specimen was sent for lymphoma protocol.  DESCRIPTION OF PROCEDURE:  The patient was identified in the holding room, informed consent having been obtained including discussion of risks, benefits, alternatives, the patient was brought to the operative suite and put the operative table to the  operative table in supine position.  Anesthesia was induced and the patient was intubated by the anesthesia team using a GlideScope without difficulty.  The eyes were taped closed and the patient was given intravenous antibiotics during the case.  The  left neck incision was marked with a marking pen in a skin crease and injected with 1% lidocaine with 1:100,000 of epinephrine.  The left neck was prepped and draped in sterile  fashion.  Incision was made with a 15 blade scalpel through the skin and  extended through the subcutaneous tissue and platysma layer using Bovie electrocautery.  Dissection continued deeply in that location until the underside of the submandibular gland was reached.  Soft tissues were then elevated following the capsule of  the submandibular gland superiorly and staying deep to where the marginal mandibular nerve would be.  The node was able to be palpated and approached superior to the submandibular gland.  The edge of the node was identified and the node was retracted  inferiorly dividing connecting fascial tissues using Bovie electrocautery until the node was removed fully.  The node was passed to nursing for pathology.  Bleeding was controlled with limited cautery.  The wound was copiously irrigated with saline.  The  patient was given a Valsalva, and no additional bleeding was seen.  The platysmal layer was closed with 3-0 Vicryl in a simple interrupted fashion.  The skin was closed in subcutaneous layer using 4-0 Vicryl in a simple interrupted fashion.  Skin was  closed with skin glue.    The patient was cleaned off and returned to Anesthesia for wakeup was extubated in the recovery room in stable condition.  AN/NUANCE  D:10/08/2018 T:10/08/2018 JOB:[id number]

## 2018-10-08 NOTE — Transfer of Care (Signed)
Immediate Anesthesia Transfer of Care Note  Patient: Shelia Gardner  Procedure(s) Performed: EXCISIONAL BIOPSY OF LEFT CERVICAL LYMPH (Left )  Patient Location: PACU  Anesthesia Type:General  Level of Consciousness: awake, alert  and oriented  Airway & Oxygen Therapy: Patient Spontanous Breathing and Patient connected to nasal cannula oxygen  Post-op Assessment: Report given to RN and Post -op Vital signs reviewed and stable  Post vital signs: Reviewed and stable  Last Vitals:  Vitals Value Taken Time  BP 153/99 10/08/2018  8:40 AM  Temp    Pulse 91 10/08/2018  8:41 AM  Resp 15 10/08/2018  8:41 AM  SpO2 94 % 10/08/2018  8:41 AM  Vitals shown include unvalidated device data.  Last Pain:  Vitals:   10/08/18 0651  TempSrc:   PainSc: 0-No pain         Complications: No apparent anesthesia complications

## 2018-10-08 NOTE — H&P (Signed)
Shelia Gardner is an 84 y.o. female.   Chief Complaint: Cervical lymphadenopathy HPI: 84 year old female with bulky parotid enlargement due to enlarged nodes and also cervical adenopathy.  FNA and salivary gland biopsy were unremarkable.  She presents for node excision.  Past Medical History:  Diagnosis Date  . Arthritis   . Cancer (HCC)    breast  . Cervical lymphadenopathy   . Chronic kidney disease   . COPD mixed type ([place])   . GERD (gastroesophageal reflux disease)   . 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 Hypertension   . Interstitial lung disease ([place])   . Shortness of breath    with exertion  . Sleep apnea    does not wear cpap    Past Surgical History:  Procedure Laterality Date  . BREAST LUMPECTOMY WITH AXILLARY LYMPH NODE DISSECTION     right breast  . MULTIPLE TOOTH EXTRACTIONS    . TOTAL KNEE ARTHROPLASTY Right 10/05/2013   Procedure: TOTAL RIGHT  KNEE ARTHROPLASTY;  Surgeon: Tobi Bastos, MD;  Location: [facility] ORS;  Service: Orthopedics;  Laterality: Right;    History reviewed. No pertinent family history. Social History:  reports that she is a non-smoker but has been exposed to tobacco smoke. She has never used smokeless tobacco. She reports that she drinks alcohol. She reports that she does not use drugs.  Allergies:  Allergies  Allergen Reactions  . Pantoprazole Diarrhea and Other (See Comments)  . Spiriva [Tiotropium Bromide Monohydrate] Other (See Comments)    Pt feels that this causes constipation  . Tramadol Nausea Only    Medications Prior to Admission  Medication Sig Dispense Refill  . aspirin 81 MG tablet Take 81 mg by mouth daily.    . fexofenadine (ALLEGRA) 180 MG tablet Take 180 mg by mouth daily.    Marland Kitchen lisinopril (PRINIVIL,ZESTRIL) 5 MG tablet Take 5 mg by mouth every morning.     . montelukast (SINGULAIR) 10 MG tablet Take 10 mg by mouth at bedtime.    . Multiple Vitamin (MULTIVITAMIN) tablet Take 1 tablet by  mouth daily.    . Multiple Vitamins-Minerals (OCUVITE PO) Take 1 tablet by mouth daily.    . Omega-3 Fatty Acids (FISH OIL) 1000 MG CAPS Take 1 capsule by mouth daily.    . TRELEGY ELLIPTA 100-62.5-25 MCG/INH AEPB INHALE 1 PUFF INTO THE LUNGS DAILY (Patient taking differently: Inhale 1 puff into the lungs daily. ) 1 each 5  . acetaminophen (TYLENOL) 500 MG tablet Take 500 mg by mouth every 8 (eight) hours as needed for moderate pain.     Marland Kitchen albuterol (PROVENTIL) (2.5 MG/3ML) 0.083% nebulizer solution Take 3 mLs (2.5 mg total) by nebulization every 6 (six) hours as needed for wheezing or shortness of breath. 75 mL 12  . budesonide (PULMICORT) 0.5 MG/2ML nebulizer solution Take 2 mLs (0.5 mg total) by nebulization 2 (two) times daily. (Patient taking differently: Take 0.5 mg by nebulization 2 (two) times daily as needed (shortness of breath). ) 120 mL 11  . ipratropium-albuterol (DUONEB) 0.5-2.5 (3) MG/3ML SOLN Take 3 mLs by nebulization 4 (four) times daily. (Patient taking differently: Take 3 mLs by nebulization every 6 (six) hours as needed (shortness of breath). ) 360 mL 11    Results for orders placed or performed during the hospital encounter of 10/08/18 (from the past 48 hour(s))  CBC     Status: Abnormal   Collection Time: 10/08/18  6:38 AM  Result Value Ref Range   WBC 13.3 (H) 4.0 - 10.5 K/uL   RBC 4.38 3.87 - 5.11 MIL/uL   Hemoglobin 12.8 12.0 - 15.0 g/dL   HCT 41.0 36.0 - 46.0 %   MCV 93.6 80.0 - 100.0 fL   MCH 29.2 26.0 - 34.0 pg   MCHC 31.2 30.0 - 36.0 g/dL   RDW 13.9 11.5 - 15.5 %   Platelets 255 150 - 400 K/uL   nRBC 0.0 0.0 - 0.2 %    Comment: Performed at [facility] [address], [place], [place] [address]   No results found.  Review of Systems  All other systems reviewed and are negative.   Blood pressure (!) 157/77, pulse 86, temperature 98.2 F (36.8 C), temperature source Oral, resp. rate 16, height 5\' 3"  (1.6 m), weight 131.1 kg, SpO2 98 %. Physical  Exam  Constitutional: She is oriented to person, place, and time. She appears well-developed and well-nourished. No distress.  HENT:  Head: Normocephalic and atraumatic.  Right Ear: External ear normal.  Left Ear: External ear normal.  Nose: Nose normal.  Mouth/Throat: Oropharynx is clear and moist.  Eyes: Pupils are equal, round, and reactive to light. Conjunctivae and EOM are normal.  Neck: Normal range of motion. Neck supple.  Left greater than right parotid enlargement and zone 1 adenopathy.  Cardiovascular: Normal rate.  Respiratory: Effort normal.  Neurological: She is alert and oriented to person, place, and time. No cranial nerve deficit.  Skin: Skin is warm and dry.  Psychiatric: She has a normal mood and affect. Her behavior is normal. Judgment and thought content normal.     Assessment/Plan Cervical lymphadenopathy To OR for excision of left cervical lymph node.  Melida Quitter, MD 10/08/2018, 7:22 AM

## 2018-10-08 NOTE — Anesthesia Procedure Notes (Signed)
Procedure Name: Intubation Performed by: Milford Cage, CRNA Pre-anesthesia Checklist: Patient identified, Emergency Drugs available, Suction available and Patient being monitored Patient Re-evaluated:Patient Re-evaluated prior to induction Oxygen Delivery Method: Circle System Utilized Preoxygenation: Pre-oxygenation with 100% oxygen Induction Type: IV induction Ventilation: Mask ventilation without difficulty and Oral airway inserted - appropriate to patient size Laryngoscope Size: Glidescope and 4 Grade View: Grade I Tube type: Oral Tube size: 7.0 mm Number of attempts: 1 Airway Equipment and Method: Oral airway,  Video-laryngoscopy and Rigid stylet Placement Confirmation: ETT inserted through vocal cords under direct vision,  positive ETCO2 and breath sounds checked- equal and bilateral Secured at: 21 cm Tube secured with: Tape Dental Injury: Teeth and Oropharynx as per pre-operative assessment  Difficulty Due To: Difficulty was anticipated, Difficult Airway-  due to edematous airway and Difficult Airway- due to anterior larynx Future Recommendations: Recommend- induction with short-acting agent, and alternative techniques readily available

## 2018-10-09 ENCOUNTER — Encounter (HOSPITAL_COMMUNITY): Payer: Self-pay | Admitting: Otolaryngology

## 2019-01-03 ENCOUNTER — Encounter: Payer: Self-pay | Admitting: Internal Medicine

## 2019-01-03 ENCOUNTER — Ambulatory Visit (INDEPENDENT_AMBULATORY_CARE_PROVIDER_SITE_OTHER): Payer: Medicare Other | Admitting: Internal Medicine

## 2019-01-03 DIAGNOSIS — J3489 Other specified disorders of nose and nasal sinuses: Secondary | ICD-10-CM

## 2019-01-03 DIAGNOSIS — J9611 Chronic respiratory failure with hypoxia: Secondary | ICD-10-CM

## 2019-01-03 DIAGNOSIS — J849 Interstitial pulmonary disease, unspecified: Secondary | ICD-10-CM

## 2019-01-03 MED ORDER — IPRATROPIUM BROMIDE 0.03 % NA SOLN: NASAL | 12 refills | Status: AC

## 2019-01-03 NOTE — Progress Notes (Signed)
HPI 85 year old female never smoker (second hand smoke when husband was alive) followed for COPD, chronic hypoxic respiratory failure, with history of progressive dyspnea 3 years, interstitial groundglass changes. Had had birds and cat in home previously. Birds are gone now.. Labs 06/2016-negative for Aspergillus antigen, cryptococcal antigen, Sjogren's, scleroderma, anti-DNA, ANCA, She had her husband had lived for many years in a conspicuously moldy home according to daughter, but has moved to a different home. PFT 08/15/16-moderate restriction  with no response to dilator, mild reduction diffusion capacity. FVC 1.22/54%, FEV1 1.06/65%,FEF25-75%  ratio 0.87, TLC 76%, DLCO 63% Home sleep test 08/27/16-AHI 18.3/hour, desaturation to 56% with average saturation 88%, body weight 243 pounds. She could not get comfortable with CPAP and did not continue. CT chest 05/14/16-Interstitial lung disease characterized by patchy reticulation and ground-glass attenuation with mild architectural distortion throughout both lungs hypersensitivity pneumonitis, with the differential including nonspecific interstitial pneumonia (NSIP). These findings are not consistent with usual interstitial pneumonia (UIP PFT 08/31/17-severe obstructive airways disease with response to bronchodilator, moderate restriction, moderate reduction of diffusion.  FVC 1.20/55%, FEV1 1.00/63%, ratio 0.83, TLC 75%, DLCO 56%  Hypersensitivity Pneumonia Panel-07/24/17-negative/WNL Respiratory Allergy Profile 07/24/17- total IgE 280, no specific elevations D-dimer 07/24/17-2.29 H CBC 07/24/17-WBC 12,100, eosinophils 0.9 H ACE level 07/24/17-  < 5  CT chest 07/28/17-hypersensitivity pneumonitis, with the differential including nonspecific interstitial pneumonia (NSIP). These findings are not consistent with usual interstitial pneumonia (UIP) Walk Test 07/02/2018-requalified for home oxygen with desaturation to  87% --------------------------------------------------------------------------------------------------------  07/02/2018- 85 year old female never smoker (second hand smoke when husband was alive) followed for history of progressive dyspnea 3 years, interstitial groundglass changes/ NSIP. Had had birds and cat in home previously. Birds are gone now., OSA, complicated by CAD/aortic atherosclerosis O2 3L/ [employer]     Requalifying today CT soft tissue neck 05/28/2018-bilateral parotid masses, cervical and mediastinal adenopathy, asymmetric fullness right nasopharynx, C-spine stenosis Needle biopsy head and neck 06/15/2018-lymphoid hyperplasia COPD: Pt states she is doing well with her breathing. Pt needs to have OV notes and O2 levels sent to [place] for recert.  Trelegy has worked wonders for her.  Denies cough or wheeze.  Oxygen does help.  Now on cephalexin for lymphoid hyperplasia which had seemed to improve after antibiotic given for dental extraction.  Has not been needing her nebulizer machine. Walk Test 07/02/2018-requalified for home oxygen with desaturation to 87%  01/03/2019- 85 year old female never smoker (second hand smoke when husband was alive) followed for history of progressive dyspnea 3 years, severe COPD, interstitial groundglass changes/ NSIP. Had had birds and cat in home previously. Birds are gone now., OSA, complicated by CAD/aortic atherosclerosis Lymph node biopsy 10/08/2018-lymphoid hyperplasia.  She will follow this up with Dr. Felipa Eth.  -----chronic respiratory failure with hypoxia, copd mixed-lot of congestion in afternoon, SOB with exertion O2 3L/ [employer] She likes Trelegy which has helped prevent exacerbations of cough and chest congestion.  She complains of watery rhinorrhea in the afternoons.  Had dropped away from using oxygen at night and we questioned whether rhinorrhea might result from drying as she uses oxygen in the daytime.  Discussed oxygen use.  Allegra has not  helped rhinorrhea.  She feels her breathing otherwise has been stable with little routine cough.  At age 59, without progressive chest symptoms, we elected to wait on CT scan update.  ROS-see HPI   + = Positive Constitutional:    weight loss, night sweats, fevers, chills, fatigue, lassitude. HEENT:    headaches, difficulty swallowing, tooth/dental problems, sore  throat,       sneezing, itching, ear ache, +nasal congestion, +post nasal drip, snoring CV:    chest pain, orthopnea, PND, + swelling in lower extremities, anasarca,                                               dizziness, palpitations Resp:   +shortness of breath with exertion or at rest.                productive cough,   non-productive cough, coughing up of blood.              change in color of mucus.  wheezing.   Skin:    rash or lesions. GI:  No-   heartburn, indigestion, abdominal pain, nausea, vomiting, diarrhea,                 change in bowel habits, loss of appetite GU: dysuria, change in color of urine, no urgency or frequency.   flank pain. MS:   joint pain, stiffness, decreased range of motion, back pain. Neuro-     nothing unusual Psych:  change in mood or affect.  depression or anxiety.   memory loss.  OBJ- Physical Exam  + morbidly obese, + wheelchair, + O2 3 L General- Alert, Oriented, Affect-appropriate, Distress- none acute,  Skin- rash-none, lesions- none, excoriation- none Lymphadenopathy- none Head- atraumatic            Eyes- Gross vision intact, PERRLA, conjunctivae and secretions clear            Ears- Hearing, canals-normal            Nose- Clear, no-Septal dev, mucus, polyps, erosion, perforation             Throat- Mallampati IV , mucosa clear , drainage- none, tonsils- atrophic Neck- flexible , trachea midline, no stridor , thyroid nl, carotid no bruit Chest - symmetrical excursion , unlabored           Heart/CV- RRR , no murmur , no gallop  , no rub, nl s1 s2                           - JVD- none ,  edema 1+ , stasis changes + pink bilaterally, varices- none           Lung-  wheeze-none, cough- none , dullness-none, rub- none           Chest wall-  Abd-  Br/ Gen/ Rectal- Not done, not indicated Extrem- cyanosis- none, clubbing, none, atrophy- none, strength- nl, Homan's NEG Neuro- grossly intact to observation

## 2019-01-03 NOTE — Assessment & Plan Note (Signed)
Have asked her to resume use of oxygen at night.  Her daughter did not realize she had stopped.

## 2019-01-03 NOTE — Assessment & Plan Note (Signed)
This is going to impact alveolar ventilation, exercise tolerance and her overall health.  Efforts at weight loss are encouraged.

## 2019-01-03 NOTE — Assessment & Plan Note (Signed)
Symptomatically stable, recognizing that she has a very sedentary lifestyle and might not notice slow change in exercise tolerance from lung disease.  Little cough. Plan-we are going to follow conservatively for now.

## 2019-01-03 NOTE — Assessment & Plan Note (Signed)
This is likely a vasomotor rhinitis. Plan-try ipratropium nasal spray

## 2019-01-03 NOTE — Patient Instructions (Signed)
Script sent for ipratropium nasal spray  I recommend you use your oxygen at night   2L, and as needed during the day  Please call if we can help

## 2019-01-16 IMAGING — DX DG CHEST 2V
2 series · 2 of 2 positions shown · non-contrast
Comparison: Chest CT 05/14/2016

CLINICAL DATA: Cough and congestion.

EXAM:
CHEST  2 VIEW

[chest pa]
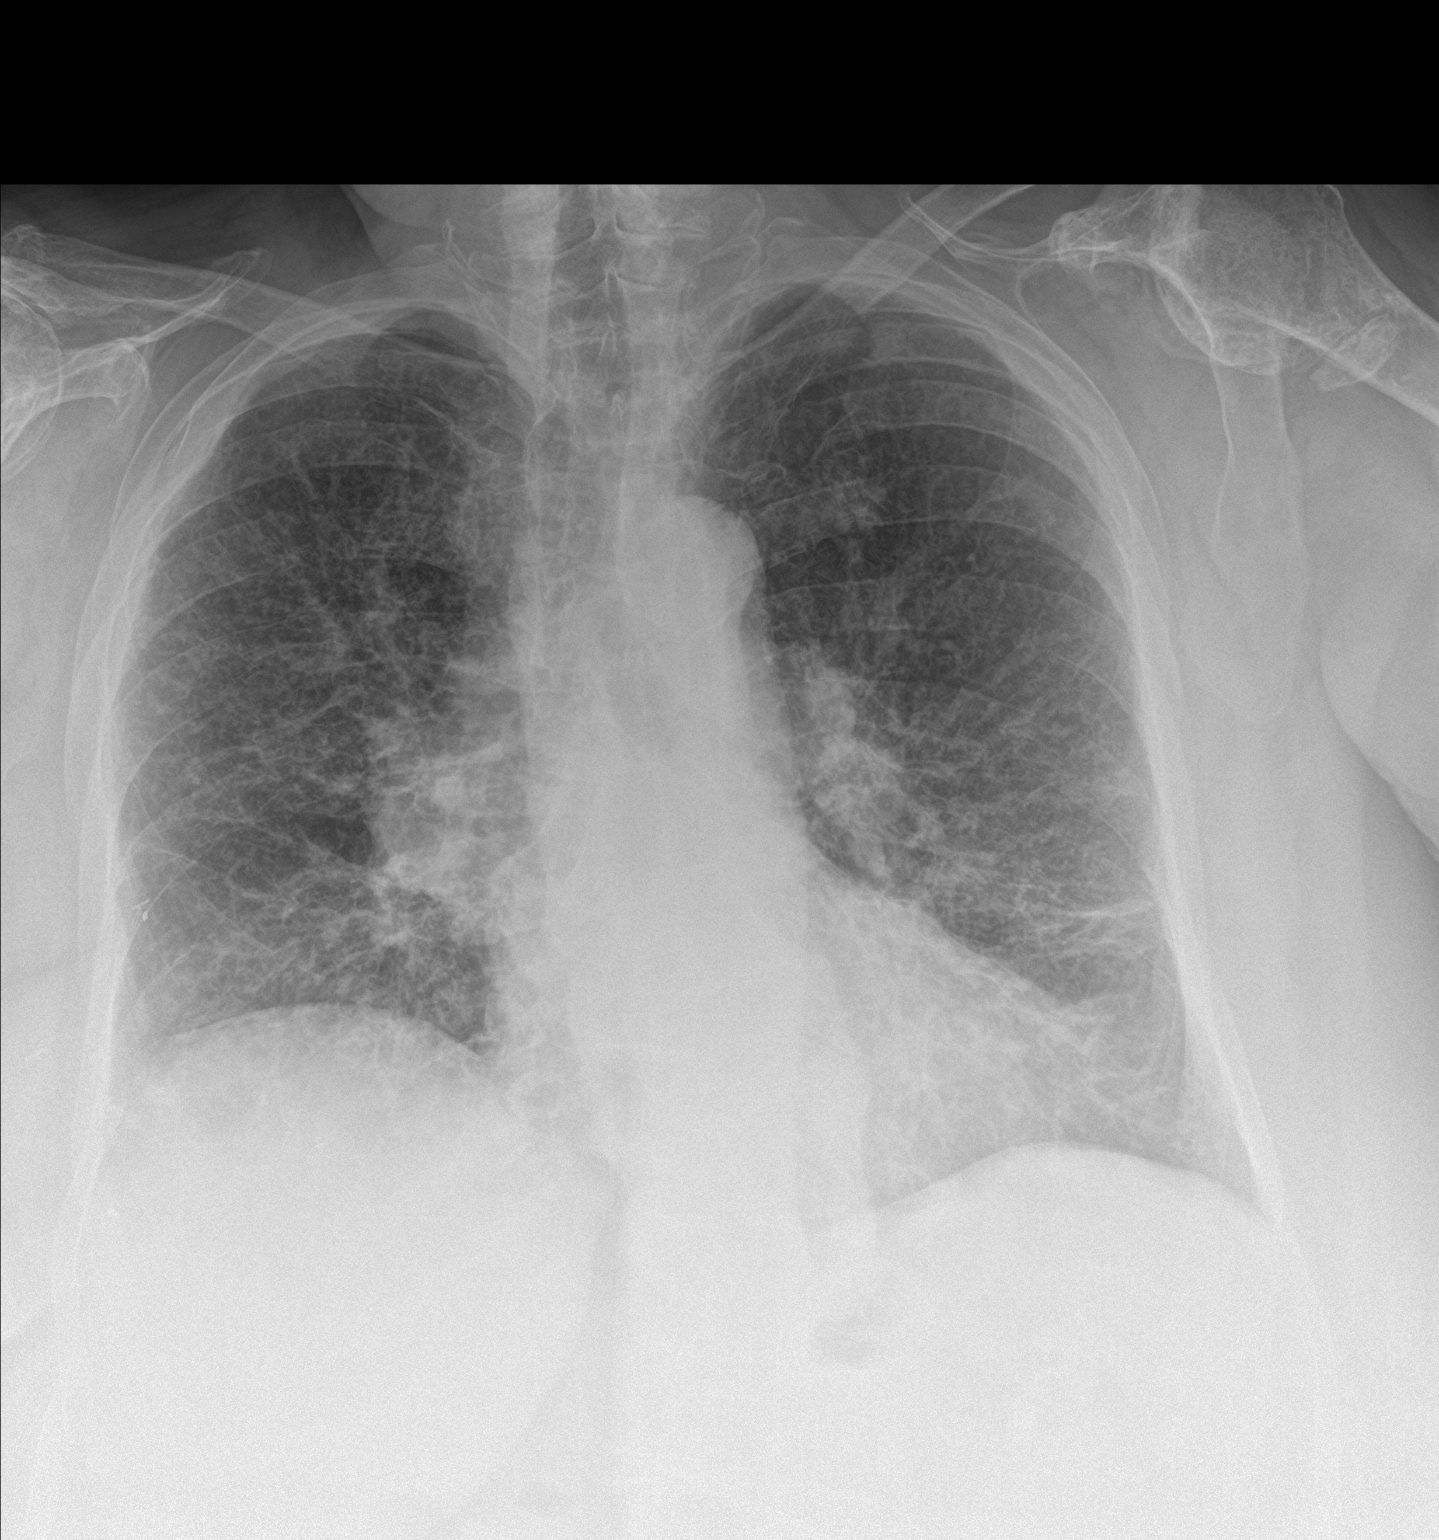

[chest lat]
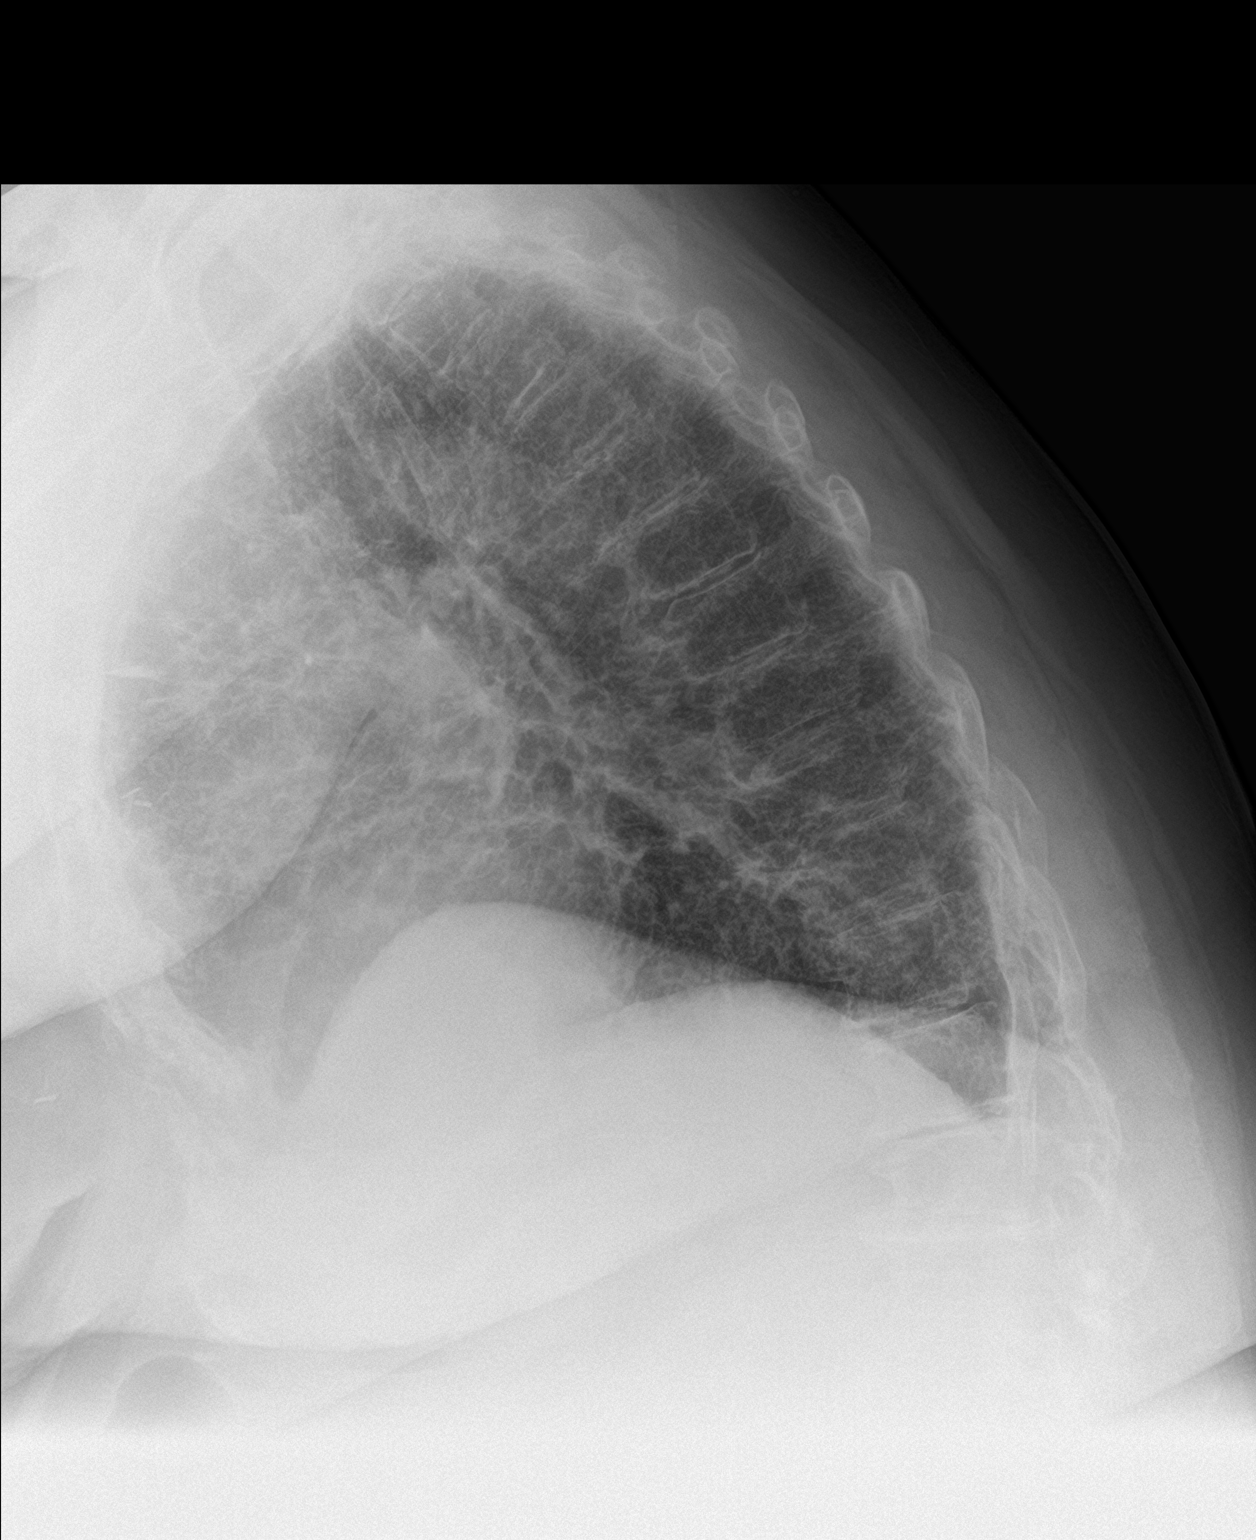

[2 of 2 positions shown; findings below may reference images not displayed]

FINDINGS: Bilateral coarse interstitial opacities that are more prominent than
before, but have a stable pattern. No change in inflation. Areas of
mild linear scarring in the left lung. Borderline heart size that is
stable. Asymmetric prominence of the right hilum. This appearance is
similar to 05/07/2016, which was subsequently followed by chest CT
showing only hilar vessels. Postoperative right breast. Severe
glenohumeral osteoarthritis. High right humeral head.
IMPRESSION: 1. Interstitial lung disease as described on prior chest CT. Stable
pattern of interstitial opacity, but possibly progressed since 1
year prior.
2. Asymmetric prominence of the right hilum is stable from prior
chest x-ray and correlates with vascular structures on 05/14/2016
chest CT.

## 2019-02-04 ENCOUNTER — Other Ambulatory Visit: Payer: Self-pay | Admitting: Internal Medicine

## 2019-02-04 DIAGNOSIS — J449 Chronic obstructive pulmonary disease, unspecified: Secondary | ICD-10-CM

## 2019-02-07 DIAGNOSIS — J9611 Chronic respiratory failure with hypoxia: Secondary | ICD-10-CM | POA: Diagnosis not present

## 2019-02-07 DIAGNOSIS — I700 Atherosclerosis of aorta: Secondary | ICD-10-CM | POA: Diagnosis not present

## 2019-02-07 DIAGNOSIS — L89150 Pressure ulcer of sacral region, unstageable: Secondary | ICD-10-CM | POA: Diagnosis not present

## 2019-02-07 DIAGNOSIS — N183 Chronic kidney disease, stage 3 (moderate): Secondary | ICD-10-CM | POA: Diagnosis not present

## 2019-02-07 DIAGNOSIS — J449 Chronic obstructive pulmonary disease, unspecified: Secondary | ICD-10-CM | POA: Diagnosis not present

## 2019-02-07 DIAGNOSIS — Z79899 Other long term (current) drug therapy: Secondary | ICD-10-CM | POA: Diagnosis not present

## 2019-02-07 DIAGNOSIS — J3089 Other allergic rhinitis: Secondary | ICD-10-CM | POA: Diagnosis not present

## 2019-02-07 DIAGNOSIS — Z6841 Body Mass Index (BMI) 40.0 and over, adult: Secondary | ICD-10-CM | POA: Diagnosis not present

## 2019-02-07 DIAGNOSIS — B351 Tinea unguium: Secondary | ICD-10-CM | POA: Diagnosis not present

## 2019-02-07 DIAGNOSIS — J8410 Pulmonary fibrosis, unspecified: Secondary | ICD-10-CM | POA: Diagnosis not present

## 2019-02-07 DIAGNOSIS — R609 Edema, unspecified: Secondary | ICD-10-CM | POA: Diagnosis not present

## 2019-03-23 DIAGNOSIS — N183 Chronic kidney disease, stage 3 (moderate): Secondary | ICD-10-CM | POA: Diagnosis not present

## 2019-03-23 DIAGNOSIS — I129 Hypertensive chronic kidney disease with stage 1 through stage 4 chronic kidney disease, or unspecified chronic kidney disease: Secondary | ICD-10-CM | POA: Diagnosis not present

## 2019-03-23 DIAGNOSIS — I872 Venous insufficiency (chronic) (peripheral): Secondary | ICD-10-CM | POA: Diagnosis not present

## 2019-03-23 DIAGNOSIS — Z1389 Encounter for screening for other disorder: Secondary | ICD-10-CM | POA: Diagnosis not present

## 2019-03-23 DIAGNOSIS — I700 Atherosclerosis of aorta: Secondary | ICD-10-CM | POA: Diagnosis not present

## 2019-03-23 DIAGNOSIS — J9611 Chronic respiratory failure with hypoxia: Secondary | ICD-10-CM | POA: Diagnosis not present

## 2019-03-23 DIAGNOSIS — Z0000 Encounter for general adult medical examination without abnormal findings: Secondary | ICD-10-CM | POA: Diagnosis not present

## 2019-03-23 DIAGNOSIS — J8410 Pulmonary fibrosis, unspecified: Secondary | ICD-10-CM | POA: Diagnosis not present

## 2019-03-23 DIAGNOSIS — K760 Fatty (change of) liver, not elsewhere classified: Secondary | ICD-10-CM | POA: Diagnosis not present

## 2019-03-23 DIAGNOSIS — J984 Other disorders of lung: Secondary | ICD-10-CM | POA: Diagnosis not present

## 2019-03-23 DIAGNOSIS — E7800 Pure hypercholesterolemia, unspecified: Secondary | ICD-10-CM | POA: Diagnosis not present

## 2019-03-23 DIAGNOSIS — J449 Chronic obstructive pulmonary disease, unspecified: Secondary | ICD-10-CM | POA: Diagnosis not present

## 2019-07-04 ENCOUNTER — Other Ambulatory Visit: Payer: Self-pay

## 2019-07-04 ENCOUNTER — Encounter: Payer: Self-pay | Admitting: Internal Medicine

## 2019-07-04 ENCOUNTER — Ambulatory Visit (INDEPENDENT_AMBULATORY_CARE_PROVIDER_SITE_OTHER): Payer: Medicare Other | Admitting: Internal Medicine

## 2019-07-04 DIAGNOSIS — J449 Chronic obstructive pulmonary disease, unspecified: Secondary | ICD-10-CM

## 2019-07-04 DIAGNOSIS — J9611 Chronic respiratory failure with hypoxia: Secondary | ICD-10-CM

## 2019-07-04 DIAGNOSIS — J849 Interstitial pulmonary disease, unspecified: Secondary | ICD-10-CM

## 2019-07-04 MED ORDER — ALBUTEROL SULFATE HFA 108 (90 BASE) MCG/ACT IN AERS: 2.0000 | INHALATION_SPRAY | Freq: Four times a day (QID) | RESPIRATORY_TRACT | 12 refills | Status: AC | PRN

## 2019-07-04 NOTE — Progress Notes (Signed)
HPI 85 year old female never smoker (second hand smoke when husband was alive) followed for COPD, chronic hypoxic respiratory failure, with history of progressive dyspnea 3 years, interstitial groundglass changes. Had had birds and cat in home previously. Birds are gone now.. Labs 06/2016-negative for Aspergillus antigen, cryptococcal antigen, Sjogren's, scleroderma, anti-DNA, ANCA, She had her husband had lived for many years in a conspicuously moldy home according to daughter, but has moved to a different home. PFT 08/15/16-moderate restriction  with no response to dilator, mild reduction diffusion capacity. FVC 1.22/54%, FEV1 1.06/65%,FEF25-75%  ratio 0.87, TLC 76%, DLCO 63% Home sleep test 08/27/16-AHI 18.3/hour, desaturation to 56% with average saturation 88%, body weight 243 pounds. She could not get comfortable with CPAP and did not continue. CT chest 05/14/16-Interstitial lung disease characterized by patchy reticulation and ground-glass attenuation with mild architectural distortion throughout both lungs hypersensitivity pneumonitis, with the differential including nonspecific interstitial pneumonia (NSIP). These findings are not consistent with usual interstitial pneumonia (UIP PFT 08/31/17-severe obstructive airways disease with response to bronchodilator, moderate restriction, moderate reduction of diffusion.  FVC 1.20/55%, FEV1 1.00/63%, ratio 0.83, TLC 75%, DLCO 56%  Hypersensitivity Pneumonia Panel-07/24/17-negative/WNL Respiratory Allergy Profile 07/24/17- total IgE 280, no specific elevations D-dimer 07/24/17-2.29 H CBC 07/24/17-WBC 12,100, eosinophils 0.9 H ACE level 07/24/17-  < 5  CT chest 07/28/17-hypersensitivity pneumonitis, with the differential including nonspecific interstitial pneumonia (NSIP). These findings are not consistent with usual interstitial pneumonia (UIP) Walk Test 07/02/2018-requalified for home oxygen with desaturation to 87% Lymphoid Hyperplasia- needle bx  06/15/18 Used to have Birds in home- no longer --------------------------------------------------------------------------------------------------------  01/03/2019- 85 year old female never smoker (second hand smoke when husband was alive) followed for history of progressive dyspnea 3 years, severe COPD, interstitial groundglass changes/ NSIP. Had had birds and cat in home previously. Birds are gone now., OSA, complicated by CAD/aortic atherosclerosis Lymph node biopsy 10/08/2018-lymphoid hyperplasia.  She will follow this up with Dr. Felipa Eth.  -----chronic respiratory failure with hypoxia, copd mixed-lot of congestion in afternoon, SOB with exertion O2 3L/ [employer] She likes Trelegy which has helped prevent exacerbations of cough and chest congestion.  She complains of watery rhinorrhea in the afternoons.  Had dropped away from using oxygen at night and we questioned whether rhinorrhea might result from drying as she uses oxygen in the daytime.  Discussed oxygen use.  Allegra has not helped rhinorrhea.  She feels her breathing otherwise has been stable with little routine cough.  At age 78, without progressive chest symptoms, we elected to wait on CT scan update.  07/04/2019 - Virtual Visit via Telephone Note  I connected with Dorena Bodo Bram on 07/04/19 at  3:00 PM EDT by telephone and verified that I am speaking with the correct person using two identifiers.  Location: Patient: H Provider:O   I discussed the limitations, risks, security and privacy concerns of performing an evaluation and management service by telephone and the availability of in person appointments. I also discussed with the patient that there may be a patient responsible charge related to this service. The patient expressed understanding and agreed to proceed.   History of Present Illness: 85 year old female never smoker (second hand smoke when husband was alive) followed for history of progressive dyspnea 3 years,  severe COPD, interstitial groundglass changes/ NSIP. Had had birds and cat in home previously. Birds are gone now., OSA, complicated by CAD/aortic atherosclerosis, Chronic Respiratory Failure with hypoxia, Copd mixed type Lymph node biopsy 10/08/2018-lymphoid hyperplasia..   Daughter on phone conversation with [place] O2 3L/ [employer] -----pt  states breathing is at baseline, O2 3L continuous as needed (pt daughter states she uses 75% off the time) Daily Trelegy is doing well and sufficient.  Breathing doesn't wake her  Takes her O2 off in sleep sometimes.  Observations/Objective:   Assessment and Plan: Chronic resp failure w hypoxia- to continue O2 3L COPD- Stable with Trelegy, will add rescue albuterol NSIP- Not progressing. 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 The patient was provided an opportunity to ask questions and all were answered. The patient agreed with the plan and demonstrated an understanding of the instructions.   The patient was advised to call back or seek an in-person evaluation if the symptoms worsen or if the condition fails to improve as anticipated.  I provided 18 minutes of non-face-to-face time during this encounter.   Baird Lyons, MD       ROS-see HPI   + = Positive Constitutional:    weight loss, night sweats, fevers, chills, fatigue, lassitude. HEENT:    headaches, difficulty swallowing, tooth/dental problems, sore throat,       sneezing, itching, ear ache, +nasal congestion, +post nasal drip, snoring CV:    chest pain, orthopnea, PND, + swelling in lower extremities, anasarca,                                               dizziness, palpitations Resp:   +shortness of breath with exertion or at rest.                productive cough,   non-productive cough, coughing up of blood.              change in color of  mucus.  wheezing.   Skin:    rash or lesions. GI:  No-   heartburn, indigestion, abdominal pain, nausea, vomiting, diarrhea,                 change in bowel habits, loss of appetite GU: dysuria, change in color of urine, no urgency or frequency.   flank pain. MS:   joint pain, stiffness, decreased range of motion, back pain. Neuro-     nothing unusual Psych:  change in mood or affect.  depression or anxiety.   memory loss.  OBJ- Physical Exam  + morbidly obese, + wheelchair, + O2 3 L General- Alert, Oriented, Affect-appropriate, Distress- none acute,  Skin- rash-none, lesions- none, excoriation- none Lymphadenopathy- none Head- atraumatic            Eyes- Gross vision intact, PERRLA, conjunctivae and secretions clear            Ears- Hearing, canals-normal            Nose- Clear, no-Septal dev, mucus, polyps, erosion, perforation             Throat- Mallampati IV , mucosa clear , drainage- none, tonsils- atrophic Neck- flexible , trachea midline, no stridor , thyroid nl, carotid no bruit Chest - symmetrical excursion , unlabored           Heart/CV- RRR , no murmur , no gallop  , no rub, nl s1 s2                           -  JVD- none , edema 1+ , stasis changes + pink bilaterally, varices- none           Lung-  wheeze-none, cough- none , dullness-none, rub- none           Chest wall-  Abd-  Br/ Gen/ Rectal- Not done, not indicated Extrem- cyanosis- none, clubbing, none, atrophy- none, strength- nl, Homan's NEG Neuro- grossly intact to observation

## 2019-08-21 NOTE — Assessment & Plan Note (Signed)
Stable with Trelegy Plan- rescue inhaler to have available

## 2019-08-21 NOTE — Assessment & Plan Note (Signed)
Consider hypersensitivity if she remains stable long-term after birds removed. Plan- continue to follow

## 2019-08-21 NOTE — Assessment & Plan Note (Signed)
Continues to need O2 3L

## 2019-08-28 DIAGNOSIS — Z23 Encounter for immunization: Secondary | ICD-10-CM | POA: Diagnosis not present

## 2019-11-30 ENCOUNTER — Telehealth: Payer: Self-pay | Admitting: Internal Medicine

## 2019-11-30 NOTE — Telephone Encounter (Signed)
Nasal spray that we have on pt's med list is Ipratropium Bromide 0.03 %, which is also called atrovent for pt to do 1 to 2 sprays in each nostril every 8 hours if needed for runny nose.   Called pt's daughter Shelia Gardner letting her know the name of nasal spray we have listed on pt's med list and she verbalized understanding. Nothing further needed.

## 2019-12-18 ENCOUNTER — Ambulatory Visit: Payer: Medicare Other | Attending: Internal Medicine

## 2019-12-18 DIAGNOSIS — Z23 Encounter for immunization: Secondary | ICD-10-CM | POA: Insufficient documentation

## 2019-12-18 NOTE — Progress Notes (Signed)
   Covid-19 Vaccination Clinic  Name:  Shelia Gardner    MRN: [id number] DOB: 14-Jan-1930  12/18/2019  Ms. Pettitt was observed post Covid-19 immunization for 15 minutes without incidence. She was provided with Vaccine Information Sheet and instruction to access the V-Safe system.   Ms. Raybon was instructed to call 911 with any severe reactions post vaccine: Marland Kitchen Difficulty breathing  . Swelling of your face and throat  . A fast heartbeat  . A bad rash all over your body  . Dizziness and weakness    Immunizations Administered    Name Date Dose VIS Date Route   Pfizer COVID-19 Vaccine 12/18/2019 12:52 PM 0.3 mL 11/18/2019 Intramuscular   Manufacturer: [employer]   Lot: [id number]   [place]: [id number]

## 2020-01-05 ENCOUNTER — Ambulatory Visit: Payer: Medicare Other | Admitting: Internal Medicine

## 2020-01-08 ENCOUNTER — Other Ambulatory Visit: Payer: Self-pay

## 2020-01-08 ENCOUNTER — Ambulatory Visit: Payer: BLUE CROSS/BLUE SHIELD | Attending: Internal Medicine

## 2020-01-08 DIAGNOSIS — Z23 Encounter for immunization: Secondary | ICD-10-CM | POA: Insufficient documentation

## 2020-01-08 NOTE — Progress Notes (Signed)
   Covid-19 Vaccination Clinic  Name:  Shelia Gardner    MRN: [id number] DOB: 1930-04-13  01/08/2020  Shelia Gardner was observed post Covid-19 immunization for 15 minutes without incidence. She was provided with Vaccine Information Sheet and instruction to access the V-Safe system.   Shelia Gardner was instructed to call 911 with any severe reactions post vaccine: Marland Kitchen Difficulty breathing  . Swelling of your face and throat  . A fast heartbeat  . A bad rash all over your body  . Dizziness and weakness    Immunizations Administered    Name Date Dose VIS Date Route   Pfizer COVID-19 Vaccine 01/08/2020  9:59 AM 0.3 mL 11/18/2019 Intramuscular   Manufacturer: [place]   Lot: [id number]   [place]: [id number]

## 2020-02-07 ENCOUNTER — Other Ambulatory Visit: Payer: Self-pay | Admitting: Internal Medicine

## 2020-02-07 DIAGNOSIS — J449 Chronic obstructive pulmonary disease, unspecified: Secondary | ICD-10-CM

## 2020-02-20 ENCOUNTER — Other Ambulatory Visit: Payer: Self-pay

## 2020-02-20 ENCOUNTER — Ambulatory Visit (INDEPENDENT_AMBULATORY_CARE_PROVIDER_SITE_OTHER): Payer: Medicare Other | Admitting: Internal Medicine

## 2020-02-20 DIAGNOSIS — J9611 Chronic respiratory failure with hypoxia: Secondary | ICD-10-CM | POA: Diagnosis not present

## 2020-02-20 DIAGNOSIS — Z7722 Contact with and (suspected) exposure to environmental tobacco smoke (acute) (chronic): Secondary | ICD-10-CM

## 2020-02-20 DIAGNOSIS — J449 Chronic obstructive pulmonary disease, unspecified: Secondary | ICD-10-CM

## 2020-02-20 DIAGNOSIS — J849 Interstitial pulmonary disease, unspecified: Secondary | ICD-10-CM

## 2020-02-20 NOTE — Patient Instructions (Signed)
Order- schedule overnight oximetry on room air    Dx chronic respiratory failure with hypoxia  We can continue current meds  Please call if we can help

## 2020-02-20 NOTE — Progress Notes (Signed)
HPI 86 year old female never smoker (second hand smoke when husband was alive) followed for COPD, chronic hypoxic respiratory failure, with history of progressive dyspnea 3 years, interstitial groundglass changes. Had had birds and cat in home previously. Birds are gone now.. Labs 06/2016-negative for Aspergillus antigen, cryptococcal antigen, Sjogren's, scleroderma, anti-DNA, ANCA, She had her husband had lived for many years in a conspicuously moldy home according to daughter, but has moved to a different home. PFT 08/15/16-moderate restriction  with no response to dilator, mild reduction diffusion capacity. FVC 1.22/54%, FEV1 1.06/65%,FEF25-75%  ratio 0.87, TLC 76%, DLCO 63% Home sleep test 08/27/16-AHI 18.3/hour, desaturation to 56% with average saturation 88%, body weight 243 pounds. She could not get comfortable with CPAP and did not continue. CT chest 05/14/16-Interstitial lung disease characterized by patchy reticulation and ground-glass attenuation with mild architectural distortion throughout both lungs hypersensitivity pneumonitis, with the differential including nonspecific interstitial pneumonia (NSIP). These findings are not consistent with usual interstitial pneumonia (UIP PFT 08/31/17-severe obstructive airways disease with response to bronchodilator, moderate restriction, moderate reduction of diffusion.  FVC 1.20/55%, FEV1 1.00/63%, ratio 0.83, TLC 75%, DLCO 56%  Hypersensitivity Pneumonia Panel-07/24/17-negative/WNL Respiratory Allergy Profile 07/24/17- total IgE 280, no specific elevations D-dimer 07/24/17-2.29 H CBC 07/24/17-WBC 12,100, eosinophils 0.9 H ACE level 07/24/17-  < 5  CT chest 07/28/17-hypersensitivity pneumonitis, with the differential including nonspecific interstitial pneumonia (NSIP). These findings are not consistent with usual interstitial pneumonia (UIP) Walk Test 07/02/2018-requalified for home oxygen with desaturation to 87% Lymphoid Hyperplasia- needle bx  06/15/18 Used to have Birds in home- no longer --------------------------------------------------------------------------------------------------------   07/04/2019 - Virtual Visit via Telephone Note History of Present Illness: 86 year old female never smoker (second hand smoke when husband was alive) followed for history of progressive dyspnea 3 years, severe COPD, interstitial groundglass changes/ NSIP. Had had birds and cat in home previously. Birds are gone now., OSA, complicated by CAD/aortic atherosclerosis, Chronic Respiratory Failure with hypoxia, Copd mixed type Lymph node biopsy 10/08/2018-lymphoid hyperplasia..   Daughter on phone conversation with [place] O2 3L/ [employer] -----pt states breathing is at baseline, O2 3L continuous as needed (pt daughter states she uses 75% off the time) Daily Trelegy is doing well and sufficient.  Breathing doesn't wake her  Takes her O2 off in sleep sometimes.  Observations/Objective:  Assessment and Plan: Chronic resp failure w hypoxia- to continue O2 3L COPD- Stable with Trelegy, will add rescue albuterol NSIP- Not progressing. 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  Location: Patient: H  Provider: O   I discussed the limitations, risks, security and privacy concerns of performing an evaluation and management service by telephone and the availability of in person appointments. I also discussed with the patient that there may be a patient responsible charge related to this service. The patient expressed understanding and agreed to proceed.   History of Present Illness:  86 year old female never smoker (second hand smoke when husband was alive) followed for  history of progressive dyspnea 3 years, severe COPD, interstitial groundglass changes/ NSIP. Had had birds and cat in home previously. Birds are gone now., OSA, complicated by CAD/aortic atherosclerosis, Chronic Respiratory Failure with hypoxia, Copd mixed type Lymph node biopsy 10/08/2018-lymphoid hyperplasia..   Daughter on  phone conversation with [place] O2 3L/ [employer]    Admits she's not using it much.  Trelegy 100, singulair, neb Duoneb/ Pulmicort, albuterol hfa, Had Covax Phizer x 2. Not using neb or rescue inhalers, feels Trelegy is sufficient, but makes her cough some "not worth changing". Denoes acute events.    Observations/Objective:   Assessment and Plan: Chronic resp failure with hypoxia- will want to reassess O2 need with over-night oximetry. COPD- she feels Trelegy is sufficient, without recent exacerbation  Follow Up Instructions:    I discussed the assessment and treatment plan with the patient. The patient was provided an opportunity to ask questions and all were answered. The patient agreed with the plan and demonstrated an understanding of the instructions.   The patient was advised to call back or seek an in-person evaluation if the symptoms worsen or if the condition fails to improve as anticipated.  I provided 21 minutes of non-face-to-face time during this encounter.   Baird Lyons, MD         ROS-see HPI   + = Positive Constitutional:    weight loss, night sweats, fevers, chills, fatigue, lassitude. HEENT:    headaches, difficulty swallowing, tooth/dental problems, sore throat,       sneezing, itching, ear ache, +nasal congestion, +post nasal drip, snoring CV:    chest pain, orthopnea, PND, + swelling in lower extremities, anasarca,                                               dizziness, palpitations Resp:   +shortness of breath with exertion or at rest.                productive cough,   non-productive cough, coughing up of blood.              change in  color of mucus.  wheezing.   Skin:    rash or lesions. GI:  No-   heartburn, indigestion, abdominal pain, nausea, vomiting, diarrhea,                 change in bowel habits, loss of appetite GU: dysuria, change in color of urine, no urgency or frequency.   flank pain. MS:   joint pain, stiffness, decreased range of motion, back pain. Neuro-     nothing unusual Psych:  change in mood or affect.  depression or anxiety.   memory loss.  OBJ- Physical Exam  + morbidly obese, + wheelchair, + O2 3 L General- Alert, Oriented, Affect-appropriate, Distress- none acute,  Skin- rash-none, lesions- none, excoriation- none Lymphadenopathy- none Head- atraumatic            Eyes- Gross vision intact, PERRLA, conjunctivae and secretions clear            Ears- Hearing, canals-normal            Nose- Clear, no-Septal dev, mucus, polyps, erosion, perforation             Throat- Mallampati IV , mucosa clear , drainage- none, tonsils- atrophic Neck- flexible , trachea midline, no stridor , thyroid nl, carotid no bruit Chest - symmetrical excursion , unlabored           Heart/CV- RRR , no murmur , no gallop  , no rub, nl s1 s2                           -  JVD- none , edema 1+ , stasis changes + pink bilaterally, varices- none           Lung-  wheeze-none, cough- none , dullness-none, rub- none           Chest wall-  Abd-  Br/ Gen/ Rectal- Not done, not indicated Extrem- cyanosis- none, clubbing, none, atrophy- none, strength- nl, Homan's NEG Neuro- grossly intact to observation

## 2020-03-09 ENCOUNTER — Encounter (HOSPITAL_COMMUNITY): Payer: Self-pay | Admitting: *Deleted

## 2020-03-09 ENCOUNTER — Emergency Department (HOSPITAL_COMMUNITY): Payer: Medicare Other

## 2020-03-09 ENCOUNTER — Emergency Department (HOSPITAL_COMMUNITY)
Admission: EM | Admit: 2020-03-09 | Discharge: 2020-04-07 | Disposition: E | Payer: Medicare Other | Attending: Emergency Medicine | Admitting: Emergency Medicine

## 2020-03-09 ENCOUNTER — Other Ambulatory Visit: Payer: Self-pay

## 2020-03-09 DIAGNOSIS — R404 Transient alteration of awareness: Secondary | ICD-10-CM | POA: Diagnosis not present

## 2020-03-09 DIAGNOSIS — I4510 Unspecified right bundle-branch block: Secondary | ICD-10-CM | POA: Diagnosis not present

## 2020-03-09 DIAGNOSIS — I10 Essential (primary) hypertension: Secondary | ICD-10-CM | POA: Diagnosis not present

## 2020-03-09 DIAGNOSIS — J449 Chronic obstructive pulmonary disease, unspecified: Secondary | ICD-10-CM | POA: Diagnosis not present

## 2020-03-09 DIAGNOSIS — Z853 Personal history of malignant neoplasm of breast: Secondary | ICD-10-CM | POA: Insufficient documentation

## 2020-03-09 DIAGNOSIS — R0600 Dyspnea, unspecified: Secondary | ICD-10-CM | POA: Diagnosis not present

## 2020-03-09 DIAGNOSIS — I469 Cardiac arrest, cause unspecified: Secondary | ICD-10-CM | POA: Diagnosis not present

## 2020-03-09 DIAGNOSIS — J80 Acute respiratory distress syndrome: Secondary | ICD-10-CM | POA: Diagnosis not present

## 2020-03-09 DIAGNOSIS — I499 Cardiac arrhythmia, unspecified: Secondary | ICD-10-CM | POA: Diagnosis not present

## 2020-03-09 LAB — I-STAT CHEM 8, ED
BUN: 52 mg/dL — ABNORMAL HIGH (ref 8–23)
Calcium, Ion: 1.12 mmol/L — ABNORMAL LOW (ref 1.15–1.40)
Chloride: 105 mmol/L (ref 98–111)
Creatinine, Ser: 2.5 mg/dL — ABNORMAL HIGH (ref 0.44–1.00)
Glucose, Bld: 305 mg/dL — ABNORMAL HIGH (ref 70–99)
HCT: 45 % (ref 36.0–46.0)
Hemoglobin: 15.3 g/dL — ABNORMAL HIGH (ref 12.0–15.0)
Potassium: 5 mmol/L (ref 3.5–5.1)
Sodium: 135 mmol/L (ref 135–145)
TCO2: 15 mmol/L — ABNORMAL LOW (ref 22–32)

## 2020-03-09 LAB — CBC WITH DIFFERENTIAL/PLATELET
Abs Immature Granulocytes: 1.05 10*3/uL — ABNORMAL HIGH (ref 0.00–0.07)
Basophils Absolute: 0.3 10*3/uL — ABNORMAL HIGH (ref 0.0–0.1)
Basophils Relative: 1 %
Eosinophils Absolute: 0.4 10*3/uL (ref 0.0–0.5)
Eosinophils Relative: 2 %
HCT: 45.2 % (ref 36.0–46.0)
Hemoglobin: 13.3 g/dL (ref 12.0–15.0)
Immature Granulocytes: 5 %
Lymphocytes Relative: 31 %
Lymphs Abs: 6.2 10*3/uL — ABNORMAL HIGH (ref 0.7–4.0)
MCH: 29.4 pg (ref 26.0–34.0)
MCHC: 29.4 g/dL — ABNORMAL LOW (ref 30.0–36.0)
MCV: 99.8 fL (ref 80.0–100.0)
Monocytes Absolute: 1 10*3/uL (ref 0.1–1.0)
Monocytes Relative: 5 %
Neutro Abs: 11.2 10*3/uL — ABNORMAL HIGH (ref 1.7–7.7)
Neutrophils Relative %: 56 %
Platelets: 181 10*3/uL (ref 150–400)
RBC: 4.53 MIL/uL (ref 3.87–5.11)
RDW: 14.3 % (ref 11.5–15.5)
WBC: 20.1 10*3/uL — ABNORMAL HIGH (ref 4.0–10.5)
nRBC: 0.2 % (ref 0.0–0.2)

## 2020-03-09 LAB — COMPREHENSIVE METABOLIC PANEL
ALT: 22 U/L (ref 0–44)
AST: 50 U/L — ABNORMAL HIGH (ref 15–41)
Albumin: 2.5 g/dL — ABNORMAL LOW (ref 3.5–5.0)
Alkaline Phosphatase: 70 U/L (ref 38–126)
Anion gap: 19 — ABNORMAL HIGH (ref 5–15)
BUN: 43 mg/dL — ABNORMAL HIGH (ref 8–23)
CO2: 14 mmol/L — ABNORMAL LOW (ref 22–32)
Calcium: 8.9 mg/dL (ref 8.9–10.3)
Chloride: 102 mmol/L (ref 98–111)
Creatinine, Ser: 2.79 mg/dL — ABNORMAL HIGH (ref 0.44–1.00)
GFR calc Af Amer: 17 mL/min — ABNORMAL LOW (ref >60)
GFR calc non Af Amer: 15 mL/min — ABNORMAL LOW (ref >60)
Glucose, Bld: 314 mg/dL — ABNORMAL HIGH (ref 70–99)
Potassium: 5.3 mmol/L — ABNORMAL HIGH (ref 3.5–5.1)
Sodium: 135 mmol/L (ref 135–145)
Total Bilirubin: 0.3 mg/dL (ref 0.3–1.2)
Total Protein: 7.1 g/dL (ref 6.5–8.1)

## 2020-03-09 LAB — CBG MONITORING, ED: Glucose-Capillary: 168 mg/dL — ABNORMAL HIGH (ref 70–99)

## 2020-03-09 MED FILL — Medication: Qty: 1 | Status: AC

## 2020-03-14 ENCOUNTER — Encounter: Payer: Self-pay | Admitting: Internal Medicine

## 2020-03-14 NOTE — Assessment & Plan Note (Signed)
She feels Trelegy is sufficient. We will continue that until next in-person assessment.

## 2020-03-14 NOTE — Assessment & Plan Note (Addendum)
Clinically stable by description Plan- update imaging with next ov

## 2020-03-14 NOTE — Assessment & Plan Note (Signed)
I'm a little surprised she isn't feeling need to use O2 regularly. Plan- Overnight oximetry

## 2020-04-07 NOTE — ED Provider Notes (Addendum)
[facility] EMERGENCY DEPARTMENT Provider Note   CSN: [id number] Arrival date & time: 2020-03-12  1057     History Chief Complaint  Patient presents with  . Cardiac Arrest    Shelia Gardner is a 86 y.o. female.  Patient is a 86 year old female who presents after CPR.  Per EMS, it sounded like patient had a respiratory arrest leading to cardiopulmonary arrest.  She does have a history of COPD and is on home oxygen at 2 to 3 L/min.  When EMS arrived she had labored breathing and was in respiratory distress.  Once they got her into the EMS unit, she went into cardiopulmonary arrest and CPR was initiated.  She was in a PEA rhythm and was given 2 mg of epinephrine.  CPR was continued for about 10 minutes at which time she obtained ROSC.  She was intubated with a 7.5 ET tube.  They report that she seemed to have lost pulses while being moved from the EMS unit into the resuscitation room.        Past Medical History:  Diagnosis Date  . Arthritis   . Cancer (HCC)    breast  . Cervical lymphadenopathy   . Chronic kidney disease   . COPD mixed type ([place])   . GERD (gastroesophageal reflux disease)   . 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 Hypertension   . Interstitial lung disease ([place])   . Shortness of breath    with exertion  . Sleep apnea    does not wear cpap    Patient Active Problem List   Diagnosis Date Noted  . Sinusitis, acute maxillary 02/28/2018  . Chronic respiratory failure with hypoxia ([place]) 07/24/2017  . Interstitial lung disease ([place]) 06/19/2016  . Exertional dyspnea 06/19/2016  . Obesity, morbid ([place]) 06/19/2016  . COPD mixed type ([place]) 06/19/2016  . Hypersomnia 06/19/2016  . Rhinorrhea 11/16/2013  . Nausea 10/24/2013  . Nausea and vomiting 10/24/2013  . Constipation 10/14/2013  . Hypertension 10/14/2013  . Osteoarthritis S/P right total knee arthroplasty 10/14/2013  . Postoperative anemia due to acute blood  loss 10/07/2013  . Acute blood loss anemia 10/07/2013  . Osteoarthritis of left knee 10/05/2013    Past Surgical History:  Procedure Laterality Date  . BREAST LUMPECTOMY WITH AXILLARY LYMPH NODE DISSECTION     right breast  . LYMPH NODE BIOPSY Left 10/08/2018   Procedure: EXCISIONAL BIOPSY OF LEFT CERVICAL LYMPH;  Surgeon: Melida Quitter, MD;  Location: [facility];  Service: ENT;  Laterality: Left;  Marland Kitchen MULTIPLE TOOTH EXTRACTIONS    . TOTAL KNEE ARTHROPLASTY Right 10/05/2013   Procedure: TOTAL RIGHT  KNEE ARTHROPLASTY;  Surgeon: Tobi Bastos, MD;  Location: [facility];  Service: Orthopedics;  Laterality: Right;     OB History   No obstetric history on file.     No family history on file.  Social History   Tobacco Use  . Smoking status: Passive Smoke Exposure - Never Smoker  . Smokeless tobacco: Never Used  Substance Use Topics  . Alcohol use: Yes    Comment: rarely wine  . Drug use: No    Home Medications Prior to Admission medications   Medication Sig Start Date End Date Taking? Authorizing Provider  acetaminophen (TYLENOL) 500 MG tablet Take 500 mg by mouth every 8 (eight) hours as needed for moderate pain.     [provider]  albuterol (PROVENTIL) (2.5 MG/3ML) 0.083% nebulizer solution Take 3 mLs (  2.5 mg total) by nebulization every 6 (six) hours as needed for wheezing or shortness of breath. Patient not taking: Reported on 02/20/2020 02/26/18   Deneise Lever, MD  albuterol (VENTOLIN HFA) 108 (90 Base) MCG/ACT inhaler Inhale 2 puffs into the lungs every 6 (six) hours as needed for wheezing or shortness of breath. Patient not taking: Reported on 02/20/2020 07/04/19   Baird Lyons D, MD  aspirin 81 MG tablet Take 81 mg by mouth daily.    [provider]  budesonide (PULMICORT) 0.5 MG/2ML nebulizer solution Take 2 mLs (0.5 mg total) by nebulization 2 (two) times daily. Patient not taking: Reported on 02/20/2020 11/03/17   Deneise Lever, MD  [facility] Liver Oil  5000-500 UNIT/5ML OIL Take by mouth.    [provider]  fexofenadine (ALLEGRA) 180 MG tablet Take 180 mg by mouth daily.    [provider]  ipratropium (ATROVENT) 0.03 % nasal spray 1 or 2 puffs each nostril every 8 hors if needed for runny nose 01/03/19   Young, Tarri Fuller D, MD  ipratropium-albuterol (DUONEB) 0.5-2.5 (3) MG/3ML SOLN Take 3 mLs by nebulization 4 (four) times daily. Patient not taking: Reported on 02/20/2020 06/18/16   de [place], [place], MD  lisinopril (PRINIVIL,ZESTRIL) 5 MG tablet Take 5 mg by mouth every morning.     [provider]  montelukast (SINGULAIR) 10 MG tablet Take 10 mg by mouth at bedtime.    [provider]  Multiple Vitamins-Minerals (OCUVITE PO) Take 1 tablet by mouth daily.    [provider]  Omega-3 Fatty Acids (FISH OIL) 1000 MG CAPS Take 1 capsule by mouth daily.    [provider]  TRELEGY ELLIPTA 100-62.5-25 MCG/INH AEPB INHALE 1 PUFF INTO THE LUNGS DAILY. 02/07/20   Deneise Lever, MD    Allergies    Pantoprazole, Spiriva [tiotropium bromide monohydrate], Tiotropium, and Tramadol  Review of Systems   Review of Systems  Unable to perform ROS: Intubated    Physical Exam Updated Vital Signs Ht 5\' 7"  (1.702 m)   Wt 113 kg   BMI 39.02 kg/m   Physical Exam Constitutional:      General: She is in acute distress.     Appearance: She is obese.  HENT:     Head: Normocephalic and atraumatic.     Nose: Nose normal.     Mouth/Throat:     Mouth: Mucous membranes are moist.  Eyes:     Pupils:     Right eye: Pupil is not reactive.     Left eye: Pupil is not reactive.  Cardiovascular:     Comments: No spontaneous pulses/heartrate Pulmonary:     Comments: No spontaneous breathing, ventilated through ETT with BVM.  Rhonchi bilaterally, decreased BS on left Abdominal:     General: Abdomen is flat. There is no distension.  Musculoskeletal:     Cervical back: Neck supple.  Skin:    General:  Skin is warm and dry.  Neurological:     Comments: unresponsive     ED Results / Procedures / Treatments   Labs (all labs ordered are listed, but only abnormal results are displayed) Labs Reviewed  CBC WITH DIFFERENTIAL/PLATELET - Abnormal; Notable for the following components:      Result Value   WBC 20.1 (*)    MCHC 29.4 (*)    Neutro Abs 11.2 (*)    Lymphs Abs 6.2 (*)    Basophils Absolute 0.3 (*)    Abs Immature  Granulocytes 1.05 (*)    All other components within normal limits  COMPREHENSIVE METABOLIC PANEL - Abnormal; Notable for the following components:   Potassium 5.3 (*)    CO2 14 (*)    Glucose, Bld 314 (*)    BUN 43 (*)    Creatinine, Ser 2.79 (*)    Albumin 2.5 (*)    AST 50 (*)    GFR calc non Af Amer 15 (*)    GFR calc Af Amer 17 (*)    Anion gap 19 (*)    All other components within normal limits  CBG MONITORING, ED - Abnormal; Notable for the following components:   Glucose-Capillary 168 (*)    All other components within normal limits  I-STAT CHEM 8, ED - Abnormal; Notable for the following components:   BUN 52 (*)    Creatinine, Ser 2.50 (*)    Glucose, Bld 305 (*)    Calcium, Ion 1.12 (*)    TCO2 15 (*)    Hemoglobin 15.3 (*)    All other components within normal limits    EKG EKG Interpretation  Date/Time:  2020/03/15 11:23:49 EDT Ventricular Rate:  24 PR Interval:    QRS Duration: 123 QT Interval:  489 QTC Calculation: 384 R Axis:   -53 Text Interpretation: Junctional rhythm Ventricular premature complex Right bundle branch block Anterolateral infarct, acute >>> Acute MI <<< Confirmed by Malvin Johns [phone]) on 03/15/20 4:44:28 PM   Radiology No results found.  Procedures Procedures (including critical care time)  Medications Ordered in ED Medications - No data to display  ED Course  I have reviewed the triage vital signs and the nursing notes.  Pertinent labs & imaging results that were available during my care of  the patient were reviewed by me and considered in my medical decision making (see chart for details).    MDM Rules/Calculators/A&P                      Patient had no pulses on arrival.  She was in a PEA type rhythm.  CPR was initiated.  She was given a milligram of epinephrine.  Her ET tube appeared to be in the right mainstem.  This was pulled back little bit and she had equal breath sounds following this.  CPR was continued.  She was given epinephrine every 3 to 5 minutes.  She remained in PEA rhythm.  At 2 different intervals she obtained ROSC.  However she lost pulses within a minute or 2.  After about 25 to 30 minutes of CPR, her end-tidal dropped down to 10-12.  Bedside ultrasound was performed which showed no spontaneous organized cardiac activity.  CPR was discontinued.  I notified the patient's daughter who is here in the ED with the patient.  Her labs showed an elevated creatinine and elevated glucose.  Her WBC count is elevated.  Her potassium was only minimally elevated and likely not contributing to her cardiac arrest.  I spoke with Dr. Inda Merlin who is on call for Dr. Felipa Eth who said that the death certificate can be sent to Dr. Felipa Eth.  Cardiopulmonary Resuscitation (CPR) Procedure Note Directed/Performed by: Malvin Johns I personally directed ancillary staff and/or performed CPR in an effort to regain return of spontaneous circulation and to maintain cardiac, neuro and systemic perfusion.   CRITICAL CARE Performed by: Malvin Johns Total critical care time: 25 minutes Critical care time was exclusive of separately billable procedures and treating other  patients. Critical care was necessary to treat or prevent imminent or life-threatening deterioration. Critical care was time spent personally by me on the following activities: development of treatment plan with patient and/or surrogate as well as nursing, discussions with consultants, evaluation of patient's response to  treatment, examination of patient, obtaining history from patient or surrogate, ordering and performing treatments and interventions, ordering and review of laboratory studies, ordering and review of radiographic studies, pulse oximetry and re-evaluation of patient's condition.   Final Clinical Impression(s) / ED Diagnoses Final diagnoses:  Cardiac arrest [facility])    Rx / [place] Orders ED Discharge Orders    None       Malvin Johns, MD 04/04/2020 1643    Malvin Johns, MD April 04, 2020 1644    Malvin Johns, MD 03/10/20 [phone]

## 2020-04-07 NOTE — ED Triage Notes (Signed)
Patient presents to ed via [facility] states they were called for  Resp. Distress. Ems states home health nurse had called stating patient was having difficulty breathing. Upon ems arrival states patient lungs were clear sat 94% NL, moved patient to the truck and patient went into PEA, cpr was started x 10 mins and was given epip x 2. EMS states patient had a recent dx. Of COPD. After 10 mins of cpr ROSC, x 10 mins, upon arrival to ED patient went back into PEA CPR startedPatient has ET 7.0 IO right tib/fib.  1058 Epip x 1 1100am pulse check no pulse cpr continued 1101 am epip x1 1102am #22 angio cath inserted right R FA pulse check no pulse. 1105 am no pulse epip x 1 1113 am pulse check very faint pulse 1114 am epip x 1  1119am pulse check no pulse 1120 am all ACLS efforts unsuccessful cpr discontinued.  Dr. Tamera Punt spoke with patient daughter, all belongings given to daughter, life alert necklace no other jewelery .

## 2020-04-07 DEATH — deceased

## 2020-08-23 ENCOUNTER — Ambulatory Visit: Payer: Medicare Other | Admitting: Internal Medicine
